# Patient Record
Sex: Female | Born: 1935 | Race: White | Hispanic: No | Marital: Married | Smoking: Never smoker
Health system: Southern US, Community
[De-identification: ages and names within clinical notes are randomized; demographics above are authoritative.]

## PROBLEM LIST (undated history)

## (undated) DIAGNOSIS — S82009A Unspecified fracture of unspecified patella, initial encounter for closed fracture: Secondary | ICD-10-CM

## (undated) DIAGNOSIS — F0390 Unspecified dementia without behavioral disturbance: Secondary | ICD-10-CM

## (undated) DIAGNOSIS — C801 Malignant (primary) neoplasm, unspecified: Secondary | ICD-10-CM

## (undated) DIAGNOSIS — J383 Other diseases of vocal cords: Secondary | ICD-10-CM

## (undated) DIAGNOSIS — M810 Age-related osteoporosis without current pathological fracture: Secondary | ICD-10-CM

## (undated) HISTORY — PX: SKIN CANCER EXCISION: SHX779

## (undated) HISTORY — PX: VAGINAL HYSTERECTOMY: SUR661

## (undated) HISTORY — DX: Unspecified dementia, unspecified severity, without behavioral disturbance, psychotic disturbance, mood disturbance, and anxiety: F03.90

## (undated) HISTORY — DX: Other diseases of vocal cords: J38.3

## (undated) HISTORY — DX: Unspecified fracture of unspecified patella, initial encounter for closed fracture: S82.009A

## (undated) HISTORY — PX: ROTATOR CUFF REPAIR: SHX139

---

## 2011-12-08 DIAGNOSIS — M818 Other osteoporosis without current pathological fracture: Secondary | ICD-10-CM | POA: Diagnosis not present

## 2011-12-08 DIAGNOSIS — E559 Vitamin D deficiency, unspecified: Secondary | ICD-10-CM | POA: Diagnosis not present

## 2011-12-16 DIAGNOSIS — Z961 Presence of intraocular lens: Secondary | ICD-10-CM | POA: Diagnosis not present

## 2011-12-21 DIAGNOSIS — N390 Urinary tract infection, site not specified: Secondary | ICD-10-CM | POA: Diagnosis not present

## 2011-12-24 ENCOUNTER — Other Ambulatory Visit (HOSPITAL_COMMUNITY): Payer: Self-pay | Admitting: *Deleted

## 2011-12-28 ENCOUNTER — Encounter (HOSPITAL_COMMUNITY)
Admission: RE | Admit: 2011-12-28 | Discharge: 2011-12-28 | Disposition: A | Discharge status: Home / Self Care | Payer: Medicare Other | Source: Ambulatory Visit | Attending: Endocrinology | Admitting: Endocrinology

## 2011-12-28 ENCOUNTER — Encounter (HOSPITAL_COMMUNITY): Payer: Self-pay

## 2011-12-28 DIAGNOSIS — M810 Age-related osteoporosis without current pathological fracture: Secondary | ICD-10-CM | POA: Diagnosis not present

## 2011-12-28 HISTORY — DX: Age-related osteoporosis without current pathological fracture: M81.0

## 2011-12-28 MED ORDER — ZOLEDRONIC ACID 5 MG/100ML IV SOLN
5.0000 mg | Freq: Once | INTRAVENOUS | Status: AC
  Administered 2011-12-28: 5 mg via INTRAVENOUS
  Filled 2011-12-28: qty 100

## 2011-12-28 MED ORDER — SODIUM CHLORIDE 0.9 % IV SOLN
Freq: Once | INTRAVENOUS | Status: AC
  Administered 2011-12-28: 16:00:00 via INTRAVENOUS

## 2012-02-10 DIAGNOSIS — M75100 Unspecified rotator cuff tear or rupture of unspecified shoulder, not specified as traumatic: Secondary | ICD-10-CM | POA: Diagnosis not present

## 2012-02-15 DIAGNOSIS — M6281 Muscle weakness (generalized): Secondary | ICD-10-CM | POA: Diagnosis not present

## 2012-02-15 DIAGNOSIS — M25519 Pain in unspecified shoulder: Secondary | ICD-10-CM | POA: Diagnosis not present

## 2012-02-16 DIAGNOSIS — M25519 Pain in unspecified shoulder: Secondary | ICD-10-CM | POA: Diagnosis not present

## 2012-02-16 DIAGNOSIS — M6281 Muscle weakness (generalized): Secondary | ICD-10-CM | POA: Diagnosis not present

## 2012-02-18 DIAGNOSIS — M25519 Pain in unspecified shoulder: Secondary | ICD-10-CM | POA: Diagnosis not present

## 2012-02-18 DIAGNOSIS — M6281 Muscle weakness (generalized): Secondary | ICD-10-CM | POA: Diagnosis not present

## 2012-02-21 DIAGNOSIS — M25519 Pain in unspecified shoulder: Secondary | ICD-10-CM | POA: Diagnosis not present

## 2012-02-21 DIAGNOSIS — M6281 Muscle weakness (generalized): Secondary | ICD-10-CM | POA: Diagnosis not present

## 2012-02-23 DIAGNOSIS — M25519 Pain in unspecified shoulder: Secondary | ICD-10-CM | POA: Diagnosis not present

## 2012-02-23 DIAGNOSIS — M6281 Muscle weakness (generalized): Secondary | ICD-10-CM | POA: Diagnosis not present

## 2012-02-24 DIAGNOSIS — M6281 Muscle weakness (generalized): Secondary | ICD-10-CM | POA: Diagnosis not present

## 2012-02-24 DIAGNOSIS — M25519 Pain in unspecified shoulder: Secondary | ICD-10-CM | POA: Diagnosis not present

## 2012-02-28 DIAGNOSIS — M25519 Pain in unspecified shoulder: Secondary | ICD-10-CM | POA: Diagnosis not present

## 2012-02-28 DIAGNOSIS — M6281 Muscle weakness (generalized): Secondary | ICD-10-CM | POA: Diagnosis not present

## 2012-03-01 DIAGNOSIS — M6281 Muscle weakness (generalized): Secondary | ICD-10-CM | POA: Diagnosis not present

## 2012-03-01 DIAGNOSIS — M25519 Pain in unspecified shoulder: Secondary | ICD-10-CM | POA: Diagnosis not present

## 2012-03-02 DIAGNOSIS — M6281 Muscle weakness (generalized): Secondary | ICD-10-CM | POA: Diagnosis not present

## 2012-03-02 DIAGNOSIS — M25519 Pain in unspecified shoulder: Secondary | ICD-10-CM | POA: Diagnosis not present

## 2012-03-06 DIAGNOSIS — M25519 Pain in unspecified shoulder: Secondary | ICD-10-CM | POA: Diagnosis not present

## 2012-03-06 DIAGNOSIS — M6281 Muscle weakness (generalized): Secondary | ICD-10-CM | POA: Diagnosis not present

## 2012-03-07 DIAGNOSIS — M25519 Pain in unspecified shoulder: Secondary | ICD-10-CM | POA: Diagnosis not present

## 2012-03-07 DIAGNOSIS — M6281 Muscle weakness (generalized): Secondary | ICD-10-CM | POA: Diagnosis not present

## 2012-03-09 DIAGNOSIS — M25519 Pain in unspecified shoulder: Secondary | ICD-10-CM | POA: Diagnosis not present

## 2012-03-09 DIAGNOSIS — M6281 Muscle weakness (generalized): Secondary | ICD-10-CM | POA: Diagnosis not present

## 2012-03-10 DIAGNOSIS — M75100 Unspecified rotator cuff tear or rupture of unspecified shoulder, not specified as traumatic: Secondary | ICD-10-CM | POA: Diagnosis not present

## 2012-04-13 DIAGNOSIS — L821 Other seborrheic keratosis: Secondary | ICD-10-CM | POA: Diagnosis not present

## 2012-04-13 DIAGNOSIS — L819 Disorder of pigmentation, unspecified: Secondary | ICD-10-CM | POA: Diagnosis not present

## 2012-04-13 DIAGNOSIS — Z85828 Personal history of other malignant neoplasm of skin: Secondary | ICD-10-CM | POA: Diagnosis not present

## 2012-07-02 DIAGNOSIS — R030 Elevated blood-pressure reading, without diagnosis of hypertension: Secondary | ICD-10-CM | POA: Diagnosis not present

## 2012-07-02 DIAGNOSIS — N390 Urinary tract infection, site not specified: Secondary | ICD-10-CM | POA: Diagnosis not present

## 2012-07-14 DIAGNOSIS — M810 Age-related osteoporosis without current pathological fracture: Secondary | ICD-10-CM | POA: Diagnosis not present

## 2012-07-14 DIAGNOSIS — E559 Vitamin D deficiency, unspecified: Secondary | ICD-10-CM | POA: Diagnosis not present

## 2012-07-14 DIAGNOSIS — N390 Urinary tract infection, site not specified: Secondary | ICD-10-CM | POA: Diagnosis not present

## 2012-07-14 DIAGNOSIS — Z79899 Other long term (current) drug therapy: Secondary | ICD-10-CM | POA: Diagnosis not present

## 2012-08-22 DIAGNOSIS — L821 Other seborrheic keratosis: Secondary | ICD-10-CM | POA: Diagnosis not present

## 2012-08-22 DIAGNOSIS — Z85828 Personal history of other malignant neoplasm of skin: Secondary | ICD-10-CM | POA: Diagnosis not present

## 2012-08-25 DIAGNOSIS — N390 Urinary tract infection, site not specified: Secondary | ICD-10-CM | POA: Diagnosis not present

## 2012-09-01 DIAGNOSIS — M75100 Unspecified rotator cuff tear or rupture of unspecified shoulder, not specified as traumatic: Secondary | ICD-10-CM | POA: Diagnosis not present

## 2012-09-03 DIAGNOSIS — H8110 Benign paroxysmal vertigo, unspecified ear: Secondary | ICD-10-CM | POA: Diagnosis not present

## 2012-09-26 DIAGNOSIS — Z23 Encounter for immunization: Secondary | ICD-10-CM | POA: Diagnosis not present

## 2012-10-06 DIAGNOSIS — N390 Urinary tract infection, site not specified: Secondary | ICD-10-CM | POA: Diagnosis not present

## 2012-10-19 DIAGNOSIS — M75100 Unspecified rotator cuff tear or rupture of unspecified shoulder, not specified as traumatic: Secondary | ICD-10-CM | POA: Diagnosis not present

## 2012-12-25 DIAGNOSIS — Z961 Presence of intraocular lens: Secondary | ICD-10-CM | POA: Diagnosis not present

## 2013-02-14 DIAGNOSIS — N390 Urinary tract infection, site not specified: Secondary | ICD-10-CM | POA: Diagnosis not present

## 2013-02-23 DIAGNOSIS — S8000XA Contusion of unspecified knee, initial encounter: Secondary | ICD-10-CM | POA: Diagnosis not present

## 2013-03-07 DIAGNOSIS — S82009A Unspecified fracture of unspecified patella, initial encounter for closed fracture: Secondary | ICD-10-CM | POA: Diagnosis not present

## 2013-04-04 DIAGNOSIS — S82009A Unspecified fracture of unspecified patella, initial encounter for closed fracture: Secondary | ICD-10-CM | POA: Diagnosis not present

## 2013-04-09 DIAGNOSIS — R269 Unspecified abnormalities of gait and mobility: Secondary | ICD-10-CM | POA: Diagnosis not present

## 2013-04-09 DIAGNOSIS — M25569 Pain in unspecified knee: Secondary | ICD-10-CM | POA: Diagnosis not present

## 2013-04-09 DIAGNOSIS — M6281 Muscle weakness (generalized): Secondary | ICD-10-CM | POA: Diagnosis not present

## 2013-04-11 DIAGNOSIS — M25569 Pain in unspecified knee: Secondary | ICD-10-CM | POA: Diagnosis not present

## 2013-04-11 DIAGNOSIS — R269 Unspecified abnormalities of gait and mobility: Secondary | ICD-10-CM | POA: Diagnosis not present

## 2013-04-11 DIAGNOSIS — M6281 Muscle weakness (generalized): Secondary | ICD-10-CM | POA: Diagnosis not present

## 2013-04-12 DIAGNOSIS — M6281 Muscle weakness (generalized): Secondary | ICD-10-CM | POA: Diagnosis not present

## 2013-04-12 DIAGNOSIS — M25569 Pain in unspecified knee: Secondary | ICD-10-CM | POA: Diagnosis not present

## 2013-04-12 DIAGNOSIS — R269 Unspecified abnormalities of gait and mobility: Secondary | ICD-10-CM | POA: Diagnosis not present

## 2013-04-12 DIAGNOSIS — D1801 Hemangioma of skin and subcutaneous tissue: Secondary | ICD-10-CM | POA: Diagnosis not present

## 2013-04-12 DIAGNOSIS — Z85828 Personal history of other malignant neoplasm of skin: Secondary | ICD-10-CM | POA: Diagnosis not present

## 2013-04-12 DIAGNOSIS — L821 Other seborrheic keratosis: Secondary | ICD-10-CM | POA: Diagnosis not present

## 2013-04-14 ENCOUNTER — Emergency Department: Payer: Self-pay | Admitting: Emergency Medicine

## 2013-04-14 DIAGNOSIS — S5010XA Contusion of unspecified forearm, initial encounter: Secondary | ICD-10-CM | POA: Diagnosis not present

## 2013-04-16 DIAGNOSIS — R269 Unspecified abnormalities of gait and mobility: Secondary | ICD-10-CM | POA: Diagnosis not present

## 2013-04-16 DIAGNOSIS — M6281 Muscle weakness (generalized): Secondary | ICD-10-CM | POA: Diagnosis not present

## 2013-04-16 DIAGNOSIS — M25569 Pain in unspecified knee: Secondary | ICD-10-CM | POA: Diagnosis not present

## 2013-04-18 DIAGNOSIS — R269 Unspecified abnormalities of gait and mobility: Secondary | ICD-10-CM | POA: Diagnosis not present

## 2013-04-18 DIAGNOSIS — M6281 Muscle weakness (generalized): Secondary | ICD-10-CM | POA: Diagnosis not present

## 2013-04-18 DIAGNOSIS — M25569 Pain in unspecified knee: Secondary | ICD-10-CM | POA: Diagnosis not present

## 2013-04-19 DIAGNOSIS — R269 Unspecified abnormalities of gait and mobility: Secondary | ICD-10-CM | POA: Diagnosis not present

## 2013-04-19 DIAGNOSIS — M25569 Pain in unspecified knee: Secondary | ICD-10-CM | POA: Diagnosis not present

## 2013-04-19 DIAGNOSIS — M6281 Muscle weakness (generalized): Secondary | ICD-10-CM | POA: Diagnosis not present

## 2013-04-24 DIAGNOSIS — M6281 Muscle weakness (generalized): Secondary | ICD-10-CM | POA: Diagnosis not present

## 2013-04-24 DIAGNOSIS — R269 Unspecified abnormalities of gait and mobility: Secondary | ICD-10-CM | POA: Diagnosis not present

## 2013-04-24 DIAGNOSIS — M25569 Pain in unspecified knee: Secondary | ICD-10-CM | POA: Diagnosis not present

## 2013-04-25 DIAGNOSIS — M25569 Pain in unspecified knee: Secondary | ICD-10-CM | POA: Diagnosis not present

## 2013-04-25 DIAGNOSIS — R269 Unspecified abnormalities of gait and mobility: Secondary | ICD-10-CM | POA: Diagnosis not present

## 2013-04-25 DIAGNOSIS — M6281 Muscle weakness (generalized): Secondary | ICD-10-CM | POA: Diagnosis not present

## 2013-04-27 DIAGNOSIS — M6281 Muscle weakness (generalized): Secondary | ICD-10-CM | POA: Diagnosis not present

## 2013-04-27 DIAGNOSIS — R269 Unspecified abnormalities of gait and mobility: Secondary | ICD-10-CM | POA: Diagnosis not present

## 2013-04-27 DIAGNOSIS — M25569 Pain in unspecified knee: Secondary | ICD-10-CM | POA: Diagnosis not present

## 2013-04-30 DIAGNOSIS — M6281 Muscle weakness (generalized): Secondary | ICD-10-CM | POA: Diagnosis not present

## 2013-04-30 DIAGNOSIS — M25569 Pain in unspecified knee: Secondary | ICD-10-CM | POA: Diagnosis not present

## 2013-04-30 DIAGNOSIS — R269 Unspecified abnormalities of gait and mobility: Secondary | ICD-10-CM | POA: Diagnosis not present

## 2013-05-02 DIAGNOSIS — M25569 Pain in unspecified knee: Secondary | ICD-10-CM | POA: Diagnosis not present

## 2013-05-02 DIAGNOSIS — M6281 Muscle weakness (generalized): Secondary | ICD-10-CM | POA: Diagnosis not present

## 2013-05-02 DIAGNOSIS — R269 Unspecified abnormalities of gait and mobility: Secondary | ICD-10-CM | POA: Diagnosis not present

## 2013-05-04 DIAGNOSIS — M6281 Muscle weakness (generalized): Secondary | ICD-10-CM | POA: Diagnosis not present

## 2013-05-04 DIAGNOSIS — R269 Unspecified abnormalities of gait and mobility: Secondary | ICD-10-CM | POA: Diagnosis not present

## 2013-05-04 DIAGNOSIS — M25569 Pain in unspecified knee: Secondary | ICD-10-CM | POA: Diagnosis not present

## 2013-05-07 DIAGNOSIS — R269 Unspecified abnormalities of gait and mobility: Secondary | ICD-10-CM | POA: Diagnosis not present

## 2013-05-07 DIAGNOSIS — M25569 Pain in unspecified knee: Secondary | ICD-10-CM | POA: Diagnosis not present

## 2013-05-07 DIAGNOSIS — M6281 Muscle weakness (generalized): Secondary | ICD-10-CM | POA: Diagnosis not present

## 2013-05-08 DIAGNOSIS — M75100 Unspecified rotator cuff tear or rupture of unspecified shoulder, not specified as traumatic: Secondary | ICD-10-CM | POA: Diagnosis not present

## 2013-05-08 DIAGNOSIS — S82009A Unspecified fracture of unspecified patella, initial encounter for closed fracture: Secondary | ICD-10-CM | POA: Diagnosis not present

## 2013-05-09 DIAGNOSIS — M6281 Muscle weakness (generalized): Secondary | ICD-10-CM | POA: Diagnosis not present

## 2013-05-09 DIAGNOSIS — M25569 Pain in unspecified knee: Secondary | ICD-10-CM | POA: Diagnosis not present

## 2013-05-09 DIAGNOSIS — R269 Unspecified abnormalities of gait and mobility: Secondary | ICD-10-CM | POA: Diagnosis not present

## 2013-05-10 ENCOUNTER — Emergency Department: Payer: Self-pay | Admitting: Internal Medicine

## 2013-05-10 DIAGNOSIS — A46 Erysipelas: Secondary | ICD-10-CM | POA: Diagnosis not present

## 2013-05-10 DIAGNOSIS — L03211 Cellulitis of face: Secondary | ICD-10-CM | POA: Diagnosis not present

## 2013-05-10 DIAGNOSIS — L0201 Cutaneous abscess of face: Secondary | ICD-10-CM | POA: Diagnosis not present

## 2013-05-10 DIAGNOSIS — L089 Local infection of the skin and subcutaneous tissue, unspecified: Secondary | ICD-10-CM | POA: Diagnosis not present

## 2013-05-10 DIAGNOSIS — M25569 Pain in unspecified knee: Secondary | ICD-10-CM | POA: Diagnosis not present

## 2013-05-10 DIAGNOSIS — R269 Unspecified abnormalities of gait and mobility: Secondary | ICD-10-CM | POA: Diagnosis not present

## 2013-05-10 DIAGNOSIS — M6281 Muscle weakness (generalized): Secondary | ICD-10-CM | POA: Diagnosis not present

## 2013-05-22 DIAGNOSIS — R269 Unspecified abnormalities of gait and mobility: Secondary | ICD-10-CM | POA: Diagnosis not present

## 2013-05-22 DIAGNOSIS — M25569 Pain in unspecified knee: Secondary | ICD-10-CM | POA: Diagnosis not present

## 2013-05-22 DIAGNOSIS — M6281 Muscle weakness (generalized): Secondary | ICD-10-CM | POA: Diagnosis not present

## 2013-05-23 DIAGNOSIS — M6281 Muscle weakness (generalized): Secondary | ICD-10-CM | POA: Diagnosis not present

## 2013-05-23 DIAGNOSIS — M25569 Pain in unspecified knee: Secondary | ICD-10-CM | POA: Diagnosis not present

## 2013-05-23 DIAGNOSIS — R269 Unspecified abnormalities of gait and mobility: Secondary | ICD-10-CM | POA: Diagnosis not present

## 2013-07-16 ENCOUNTER — Ambulatory Visit: Payer: Self-pay | Admitting: Family Medicine

## 2013-07-16 DIAGNOSIS — R1084 Generalized abdominal pain: Secondary | ICD-10-CM | POA: Diagnosis not present

## 2013-07-16 DIAGNOSIS — K5900 Constipation, unspecified: Secondary | ICD-10-CM | POA: Diagnosis not present

## 2013-07-16 DIAGNOSIS — R1031 Right lower quadrant pain: Secondary | ICD-10-CM | POA: Diagnosis not present

## 2013-07-16 DIAGNOSIS — N200 Calculus of kidney: Secondary | ICD-10-CM | POA: Diagnosis not present

## 2013-07-24 DIAGNOSIS — I059 Rheumatic mitral valve disease, unspecified: Secondary | ICD-10-CM | POA: Diagnosis not present

## 2013-07-24 DIAGNOSIS — N390 Urinary tract infection, site not specified: Secondary | ICD-10-CM | POA: Diagnosis not present

## 2013-07-24 DIAGNOSIS — Z9189 Other specified personal risk factors, not elsewhere classified: Secondary | ICD-10-CM | POA: Diagnosis not present

## 2013-07-24 DIAGNOSIS — Z79899 Other long term (current) drug therapy: Secondary | ICD-10-CM | POA: Diagnosis not present

## 2013-07-24 DIAGNOSIS — E7800 Pure hypercholesterolemia, unspecified: Secondary | ICD-10-CM | POA: Diagnosis not present

## 2013-07-24 DIAGNOSIS — R5381 Other malaise: Secondary | ICD-10-CM | POA: Diagnosis not present

## 2013-09-20 ENCOUNTER — Ambulatory Visit: Payer: Self-pay

## 2013-09-20 DIAGNOSIS — M810 Age-related osteoporosis without current pathological fracture: Secondary | ICD-10-CM | POA: Diagnosis not present

## 2013-09-24 DIAGNOSIS — N760 Acute vaginitis: Secondary | ICD-10-CM | POA: Diagnosis not present

## 2013-09-24 DIAGNOSIS — N3000 Acute cystitis without hematuria: Secondary | ICD-10-CM | POA: Diagnosis not present

## 2013-10-03 DIAGNOSIS — Z23 Encounter for immunization: Secondary | ICD-10-CM | POA: Diagnosis not present

## 2013-10-15 DIAGNOSIS — N390 Urinary tract infection, site not specified: Secondary | ICD-10-CM | POA: Diagnosis not present

## 2013-10-17 DIAGNOSIS — Q638 Other specified congenital malformations of kidney: Secondary | ICD-10-CM | POA: Diagnosis not present

## 2013-10-17 DIAGNOSIS — R339 Retention of urine, unspecified: Secondary | ICD-10-CM | POA: Diagnosis not present

## 2013-10-17 DIAGNOSIS — N3020 Other chronic cystitis without hematuria: Secondary | ICD-10-CM | POA: Diagnosis not present

## 2013-10-17 DIAGNOSIS — N200 Calculus of kidney: Secondary | ICD-10-CM | POA: Diagnosis not present

## 2013-11-05 DIAGNOSIS — N3020 Other chronic cystitis without hematuria: Secondary | ICD-10-CM | POA: Diagnosis not present

## 2013-11-05 DIAGNOSIS — N3941 Urge incontinence: Secondary | ICD-10-CM | POA: Diagnosis not present

## 2013-11-05 DIAGNOSIS — N200 Calculus of kidney: Secondary | ICD-10-CM | POA: Diagnosis not present

## 2013-11-12 DIAGNOSIS — M6281 Muscle weakness (generalized): Secondary | ICD-10-CM | POA: Diagnosis not present

## 2013-11-12 DIAGNOSIS — R42 Dizziness and giddiness: Secondary | ICD-10-CM | POA: Diagnosis not present

## 2013-11-12 DIAGNOSIS — R5383 Other fatigue: Secondary | ICD-10-CM | POA: Diagnosis not present

## 2013-11-12 DIAGNOSIS — Z79899 Other long term (current) drug therapy: Secondary | ICD-10-CM | POA: Diagnosis not present

## 2013-11-12 DIAGNOSIS — R5381 Other malaise: Secondary | ICD-10-CM | POA: Diagnosis not present

## 2013-11-21 DIAGNOSIS — H905 Unspecified sensorineural hearing loss: Secondary | ICD-10-CM | POA: Diagnosis not present

## 2013-11-21 DIAGNOSIS — H6120 Impacted cerumen, unspecified ear: Secondary | ICD-10-CM | POA: Diagnosis not present

## 2013-11-21 DIAGNOSIS — R42 Dizziness and giddiness: Secondary | ICD-10-CM | POA: Diagnosis not present

## 2013-12-03 ENCOUNTER — Other Ambulatory Visit: Payer: Self-pay | Admitting: Family Medicine

## 2013-12-03 DIAGNOSIS — I959 Hypotension, unspecified: Secondary | ICD-10-CM | POA: Diagnosis not present

## 2013-12-03 DIAGNOSIS — R42 Dizziness and giddiness: Secondary | ICD-10-CM | POA: Diagnosis not present

## 2013-12-03 LAB — BASIC METABOLIC PANEL
Anion Gap: 5 — ABNORMAL LOW (ref 7–16)
BUN: 20 mg/dL — ABNORMAL HIGH (ref 7–18)
Calcium, Total: 10.2 mg/dL — ABNORMAL HIGH (ref 8.5–10.1)
Chloride: 100 mmol/L (ref 98–107)
Co2: 32 mmol/L (ref 21–32)
Creatinine: 0.58 mg/dL — ABNORMAL LOW (ref 0.60–1.30)
EGFR (African American): >60
EGFR (Non-African Amer.): >60
Glucose: 77 mg/dL (ref 65–99)
Osmolality: 275 (ref 275–301)
Potassium: 3.9 mmol/L (ref 3.5–5.1)
Sodium: 137 mmol/L (ref 136–145)

## 2013-12-05 DIAGNOSIS — R55 Syncope and collapse: Secondary | ICD-10-CM | POA: Diagnosis not present

## 2013-12-05 DIAGNOSIS — R42 Dizziness and giddiness: Secondary | ICD-10-CM | POA: Diagnosis not present

## 2013-12-11 DIAGNOSIS — L723 Sebaceous cyst: Secondary | ICD-10-CM | POA: Diagnosis not present

## 2013-12-11 DIAGNOSIS — D485 Neoplasm of uncertain behavior of skin: Secondary | ICD-10-CM | POA: Diagnosis not present

## 2013-12-11 DIAGNOSIS — L821 Other seborrheic keratosis: Secondary | ICD-10-CM | POA: Diagnosis not present

## 2013-12-14 DIAGNOSIS — R42 Dizziness and giddiness: Secondary | ICD-10-CM | POA: Diagnosis not present

## 2013-12-14 DIAGNOSIS — R55 Syncope and collapse: Secondary | ICD-10-CM | POA: Diagnosis not present

## 2013-12-21 DIAGNOSIS — R569 Unspecified convulsions: Secondary | ICD-10-CM | POA: Diagnosis not present

## 2013-12-21 DIAGNOSIS — R42 Dizziness and giddiness: Secondary | ICD-10-CM | POA: Diagnosis not present

## 2014-01-11 ENCOUNTER — Ambulatory Visit: Payer: Self-pay | Admitting: Neurology

## 2014-01-11 DIAGNOSIS — R42 Dizziness and giddiness: Secondary | ICD-10-CM | POA: Diagnosis not present

## 2014-01-25 ENCOUNTER — Ambulatory Visit: Payer: Self-pay | Admitting: Neurology

## 2014-01-25 DIAGNOSIS — R42 Dizziness and giddiness: Secondary | ICD-10-CM | POA: Diagnosis not present

## 2014-01-25 DIAGNOSIS — I658 Occlusion and stenosis of other precerebral arteries: Secondary | ICD-10-CM | POA: Diagnosis not present

## 2014-02-05 DIAGNOSIS — M542 Cervicalgia: Secondary | ICD-10-CM | POA: Diagnosis not present

## 2014-02-12 ENCOUNTER — Ambulatory Visit: Payer: Self-pay | Admitting: Internal Medicine

## 2014-02-12 DIAGNOSIS — M47812 Spondylosis without myelopathy or radiculopathy, cervical region: Secondary | ICD-10-CM | POA: Diagnosis not present

## 2014-02-12 DIAGNOSIS — M5030 Other cervical disc degeneration, unspecified cervical region: Secondary | ICD-10-CM | POA: Diagnosis not present

## 2014-03-19 DIAGNOSIS — R42 Dizziness and giddiness: Secondary | ICD-10-CM | POA: Diagnosis not present

## 2014-03-19 DIAGNOSIS — I491 Atrial premature depolarization: Secondary | ICD-10-CM | POA: Diagnosis not present

## 2014-03-19 DIAGNOSIS — I471 Supraventricular tachycardia: Secondary | ICD-10-CM | POA: Diagnosis not present

## 2014-03-29 DIAGNOSIS — M542 Cervicalgia: Secondary | ICD-10-CM | POA: Diagnosis not present

## 2014-04-02 DIAGNOSIS — R262 Difficulty in walking, not elsewhere classified: Secondary | ICD-10-CM | POA: Diagnosis not present

## 2014-04-02 DIAGNOSIS — M6281 Muscle weakness (generalized): Secondary | ICD-10-CM | POA: Diagnosis not present

## 2014-04-03 DIAGNOSIS — Q638 Other specified congenital malformations of kidney: Secondary | ICD-10-CM | POA: Diagnosis not present

## 2014-04-03 DIAGNOSIS — N3020 Other chronic cystitis without hematuria: Secondary | ICD-10-CM | POA: Diagnosis not present

## 2014-04-03 DIAGNOSIS — N200 Calculus of kidney: Secondary | ICD-10-CM | POA: Diagnosis not present

## 2014-04-03 DIAGNOSIS — R339 Retention of urine, unspecified: Secondary | ICD-10-CM | POA: Diagnosis not present

## 2014-04-04 DIAGNOSIS — M6281 Muscle weakness (generalized): Secondary | ICD-10-CM | POA: Diagnosis not present

## 2014-04-04 DIAGNOSIS — R262 Difficulty in walking, not elsewhere classified: Secondary | ICD-10-CM | POA: Diagnosis not present

## 2014-04-08 DIAGNOSIS — R262 Difficulty in walking, not elsewhere classified: Secondary | ICD-10-CM | POA: Diagnosis not present

## 2014-04-08 DIAGNOSIS — M6281 Muscle weakness (generalized): Secondary | ICD-10-CM | POA: Diagnosis not present

## 2014-04-10 DIAGNOSIS — R262 Difficulty in walking, not elsewhere classified: Secondary | ICD-10-CM | POA: Diagnosis not present

## 2014-04-10 DIAGNOSIS — M6281 Muscle weakness (generalized): Secondary | ICD-10-CM | POA: Diagnosis not present

## 2014-04-11 DIAGNOSIS — M6281 Muscle weakness (generalized): Secondary | ICD-10-CM | POA: Diagnosis not present

## 2014-04-11 DIAGNOSIS — R262 Difficulty in walking, not elsewhere classified: Secondary | ICD-10-CM | POA: Diagnosis not present

## 2014-04-15 DIAGNOSIS — M6281 Muscle weakness (generalized): Secondary | ICD-10-CM | POA: Diagnosis not present

## 2014-04-15 DIAGNOSIS — R262 Difficulty in walking, not elsewhere classified: Secondary | ICD-10-CM | POA: Diagnosis not present

## 2014-04-17 DIAGNOSIS — M6281 Muscle weakness (generalized): Secondary | ICD-10-CM | POA: Diagnosis not present

## 2014-04-17 DIAGNOSIS — R262 Difficulty in walking, not elsewhere classified: Secondary | ICD-10-CM | POA: Diagnosis not present

## 2014-04-18 DIAGNOSIS — R262 Difficulty in walking, not elsewhere classified: Secondary | ICD-10-CM | POA: Diagnosis not present

## 2014-04-18 DIAGNOSIS — M6281 Muscle weakness (generalized): Secondary | ICD-10-CM | POA: Diagnosis not present

## 2014-04-29 DIAGNOSIS — D1801 Hemangioma of skin and subcutaneous tissue: Secondary | ICD-10-CM | POA: Diagnosis not present

## 2014-04-29 DIAGNOSIS — Z85828 Personal history of other malignant neoplasm of skin: Secondary | ICD-10-CM | POA: Diagnosis not present

## 2014-04-29 DIAGNOSIS — L570 Actinic keratosis: Secondary | ICD-10-CM | POA: Diagnosis not present

## 2014-04-29 DIAGNOSIS — L821 Other seborrheic keratosis: Secondary | ICD-10-CM | POA: Diagnosis not present

## 2014-08-01 DIAGNOSIS — E785 Hyperlipidemia, unspecified: Secondary | ICD-10-CM | POA: Diagnosis not present

## 2014-08-01 DIAGNOSIS — R7309 Other abnormal glucose: Secondary | ICD-10-CM | POA: Diagnosis not present

## 2014-08-01 DIAGNOSIS — I059 Rheumatic mitral valve disease, unspecified: Secondary | ICD-10-CM | POA: Diagnosis not present

## 2014-08-01 DIAGNOSIS — Z79899 Other long term (current) drug therapy: Secondary | ICD-10-CM | POA: Diagnosis not present

## 2014-08-02 DIAGNOSIS — Z79899 Other long term (current) drug therapy: Secondary | ICD-10-CM | POA: Diagnosis not present

## 2014-08-02 DIAGNOSIS — R7309 Other abnormal glucose: Secondary | ICD-10-CM | POA: Diagnosis not present

## 2014-08-02 DIAGNOSIS — E785 Hyperlipidemia, unspecified: Secondary | ICD-10-CM | POA: Diagnosis not present

## 2014-08-11 ENCOUNTER — Inpatient Hospital Stay: Payer: Self-pay | Admitting: Internal Medicine

## 2014-08-11 DIAGNOSIS — R262 Difficulty in walking, not elsewhere classified: Secondary | ICD-10-CM | POA: Diagnosis not present

## 2014-08-11 DIAGNOSIS — S82009A Unspecified fracture of unspecified patella, initial encounter for closed fracture: Secondary | ICD-10-CM | POA: Diagnosis not present

## 2014-08-11 DIAGNOSIS — S8290XD Unspecified fracture of unspecified lower leg, subsequent encounter for closed fracture with routine healing: Secondary | ICD-10-CM | POA: Diagnosis not present

## 2014-08-11 DIAGNOSIS — IMO0001 Reserved for inherently not codable concepts without codable children: Secondary | ICD-10-CM | POA: Diagnosis not present

## 2014-08-11 DIAGNOSIS — S62609A Fracture of unspecified phalanx of unspecified finger, initial encounter for closed fracture: Secondary | ICD-10-CM | POA: Diagnosis not present

## 2014-08-11 DIAGNOSIS — M6281 Muscle weakness (generalized): Secondary | ICD-10-CM | POA: Diagnosis not present

## 2014-08-11 DIAGNOSIS — M810 Age-related osteoporosis without current pathological fracture: Secondary | ICD-10-CM | POA: Diagnosis not present

## 2014-08-11 DIAGNOSIS — F0390 Unspecified dementia without behavioral disturbance: Secondary | ICD-10-CM | POA: Diagnosis not present

## 2014-08-11 DIAGNOSIS — S62639A Displaced fracture of distal phalanx of unspecified finger, initial encounter for closed fracture: Secondary | ICD-10-CM | POA: Diagnosis not present

## 2014-08-11 DIAGNOSIS — Z7982 Long term (current) use of aspirin: Secondary | ICD-10-CM | POA: Diagnosis not present

## 2014-08-11 LAB — COMPREHENSIVE METABOLIC PANEL
ALBUMIN: 4 g/dL (ref 3.4–5.0)
ANION GAP: 11 (ref 7–16)
Alkaline Phosphatase: 75 U/L
BUN: 19 mg/dL — ABNORMAL HIGH (ref 7–18)
Bilirubin,Total: 0.3 mg/dL (ref 0.2–1.0)
CALCIUM: 9 mg/dL (ref 8.5–10.1)
CO2: 25 mmol/L (ref 21–32)
Chloride: 107 mmol/L (ref 98–107)
Creatinine: 0.64 mg/dL (ref 0.60–1.30)
EGFR (Non-African Amer.): >60
GLUCOSE: 93 mg/dL (ref 65–99)
Osmolality: 287 (ref 275–301)
Potassium: 4 mmol/L (ref 3.5–5.1)
SGOT(AST): 55 U/L — ABNORMAL HIGH (ref 15–37)
SGPT (ALT): 37 U/L
Sodium: 143 mmol/L (ref 136–145)
TOTAL PROTEIN: 8 g/dL (ref 6.4–8.2)

## 2014-08-11 LAB — URINALYSIS, COMPLETE
BLOOD: NEGATIVE
Bilirubin,UR: NEGATIVE
GLUCOSE, UR: NEGATIVE mg/dL (ref 0–75)
Ketone: NEGATIVE
Leukocyte Esterase: NEGATIVE
NITRITE: NEGATIVE
Ph: 7 (ref 4.5–8.0)
Protein: NEGATIVE
RBC,UR: 4 /HPF (ref 0–5)
Specific Gravity: 1.015 (ref 1.003–1.030)
Squamous Epithelial: NONE SEEN

## 2014-08-11 LAB — CBC WITH DIFFERENTIAL/PLATELET
Basophil #: 0 10*3/uL (ref 0.0–0.1)
Basophil %: 0.4 %
EOS ABS: 0 10*3/uL (ref 0.0–0.7)
Eosinophil %: 0.5 %
HCT: 41.1 % (ref 35.0–47.0)
HGB: 13.9 g/dL (ref 12.0–16.0)
Lymphocyte #: 0.8 10*3/uL — ABNORMAL LOW (ref 1.0–3.6)
Lymphocyte %: 13.5 %
MCH: 33.8 pg (ref 26.0–34.0)
MCHC: 33.8 g/dL (ref 32.0–36.0)
MCV: 100 fL (ref 80–100)
MONOS PCT: 7.7 %
Monocyte #: 0.4 x10 3/mm (ref 0.2–0.9)
NEUTROS PCT: 77.9 %
Neutrophil #: 4.5 10*3/uL (ref 1.4–6.5)
Platelet: 170 10*3/uL (ref 150–440)
RBC: 4.11 10*6/uL (ref 3.80–5.20)
RDW: 13.2 % (ref 11.5–14.5)
WBC: 5.8 10*3/uL (ref 3.6–11.0)

## 2014-08-12 LAB — CBC WITH DIFFERENTIAL/PLATELET
BASOS PCT: 0.4 %
Basophil #: 0 10*3/uL (ref 0.0–0.1)
EOS PCT: 1.1 %
Eosinophil #: 0.1 10*3/uL (ref 0.0–0.7)
HCT: 33.2 % — ABNORMAL LOW (ref 35.0–47.0)
HGB: 11.5 g/dL — AB (ref 12.0–16.0)
Lymphocyte #: 1.1 10*3/uL (ref 1.0–3.6)
Lymphocyte %: 21.3 %
MCH: 34 pg (ref 26.0–34.0)
MCHC: 34.5 g/dL (ref 32.0–36.0)
MCV: 98 fL (ref 80–100)
Monocyte #: 0.5 x10 3/mm (ref 0.2–0.9)
Monocyte %: 9.8 %
Neutrophil #: 3.5 10*3/uL (ref 1.4–6.5)
Neutrophil %: 67.4 %
Platelet: 154 10*3/uL (ref 150–440)
RBC: 3.38 10*6/uL — AB (ref 3.80–5.20)
RDW: 13.3 % (ref 11.5–14.5)
WBC: 5.2 10*3/uL (ref 3.6–11.0)

## 2014-08-12 LAB — BASIC METABOLIC PANEL
Anion Gap: 9 (ref 7–16)
BUN: 20 mg/dL — AB (ref 7–18)
CALCIUM: 8.9 mg/dL (ref 8.5–10.1)
CHLORIDE: 109 mmol/L — AB (ref 98–107)
Co2: 26 mmol/L (ref 21–32)
Creatinine: 0.74 mg/dL (ref 0.60–1.30)
EGFR (African American): >60
EGFR (Non-African Amer.): >60
Glucose: 81 mg/dL (ref 65–99)
Osmolality: 288 (ref 275–301)
POTASSIUM: 3.8 mmol/L (ref 3.5–5.1)
Sodium: 144 mmol/L (ref 136–145)

## 2014-08-13 LAB — BASIC METABOLIC PANEL
ANION GAP: 9 (ref 7–16)
BUN: 12 mg/dL (ref 7–18)
CHLORIDE: 109 mmol/L — AB (ref 98–107)
CO2: 24 mmol/L (ref 21–32)
Calcium, Total: 8.4 mg/dL — ABNORMAL LOW (ref 8.5–10.1)
Creatinine: 0.56 mg/dL — ABNORMAL LOW (ref 0.60–1.30)
EGFR (African American): >60
Glucose: 88 mg/dL (ref 65–99)
Osmolality: 282 (ref 275–301)
Potassium: 3.8 mmol/L (ref 3.5–5.1)
Sodium: 142 mmol/L (ref 136–145)

## 2014-08-13 LAB — CBC WITH DIFFERENTIAL/PLATELET
Basophil #: 0 10*3/uL (ref 0.0–0.1)
Basophil %: 0.6 %
Eosinophil #: 0.1 10*3/uL (ref 0.0–0.7)
Eosinophil %: 1.6 %
HCT: 34.8 % — AB (ref 35.0–47.0)
HGB: 11.7 g/dL — ABNORMAL LOW (ref 12.0–16.0)
Lymphocyte #: 1 10*3/uL (ref 1.0–3.6)
Lymphocyte %: 24.3 %
MCH: 33.3 pg (ref 26.0–34.0)
MCHC: 33.5 g/dL (ref 32.0–36.0)
MCV: 99 fL (ref 80–100)
Monocyte #: 0.4 x10 3/mm (ref 0.2–0.9)
Monocyte %: 9.5 %
Neutrophil #: 2.6 10*3/uL (ref 1.4–6.5)
Neutrophil %: 64 %
PLATELETS: 149 10*3/uL — AB (ref 150–440)
RBC: 3.51 10*6/uL — AB (ref 3.80–5.20)
RDW: 13.3 % (ref 11.5–14.5)
WBC: 4 10*3/uL (ref 3.6–11.0)

## 2014-08-14 DIAGNOSIS — M810 Age-related osteoporosis without current pathological fracture: Secondary | ICD-10-CM | POA: Diagnosis not present

## 2014-08-14 DIAGNOSIS — S62609A Fracture of unspecified phalanx of unspecified finger, initial encounter for closed fracture: Secondary | ICD-10-CM | POA: Diagnosis not present

## 2014-08-14 DIAGNOSIS — S62233A Other displaced fracture of base of first metacarpal bone, unspecified hand, initial encounter for closed fracture: Secondary | ICD-10-CM | POA: Diagnosis not present

## 2014-08-14 DIAGNOSIS — F0390 Unspecified dementia without behavioral disturbance: Secondary | ICD-10-CM | POA: Diagnosis not present

## 2014-08-14 DIAGNOSIS — S62501D Fracture of unspecified phalanx of right thumb, subsequent encounter for fracture with routine healing: Secondary | ICD-10-CM | POA: Diagnosis not present

## 2014-08-14 DIAGNOSIS — M6281 Muscle weakness (generalized): Secondary | ICD-10-CM | POA: Diagnosis not present

## 2014-08-14 DIAGNOSIS — F068 Other specified mental disorders due to known physiological condition: Secondary | ICD-10-CM | POA: Diagnosis not present

## 2014-08-14 DIAGNOSIS — S82009S Unspecified fracture of unspecified patella, sequela: Secondary | ICD-10-CM | POA: Diagnosis not present

## 2014-08-14 DIAGNOSIS — S82009D Unspecified fracture of unspecified patella, subsequent encounter for closed fracture with routine healing: Secondary | ICD-10-CM | POA: Diagnosis not present

## 2014-08-14 DIAGNOSIS — IMO0001 Reserved for inherently not codable concepts without codable children: Secondary | ICD-10-CM | POA: Diagnosis not present

## 2014-08-14 DIAGNOSIS — R262 Difficulty in walking, not elsewhere classified: Secondary | ICD-10-CM | POA: Diagnosis not present

## 2014-08-14 DIAGNOSIS — S62514P Nondisplaced fracture of proximal phalanx of right thumb, subsequent encounter for fracture with malunion: Secondary | ICD-10-CM | POA: Diagnosis not present

## 2014-08-14 DIAGNOSIS — S82009A Unspecified fracture of unspecified patella, initial encounter for closed fracture: Secondary | ICD-10-CM | POA: Diagnosis not present

## 2014-08-14 DIAGNOSIS — S8290XD Unspecified fracture of unspecified lower leg, subsequent encounter for closed fracture with routine healing: Secondary | ICD-10-CM | POA: Diagnosis not present

## 2014-08-16 DIAGNOSIS — S62233A Other displaced fracture of base of first metacarpal bone, unspecified hand, initial encounter for closed fracture: Secondary | ICD-10-CM | POA: Diagnosis not present

## 2014-08-16 DIAGNOSIS — M810 Age-related osteoporosis without current pathological fracture: Secondary | ICD-10-CM | POA: Diagnosis not present

## 2014-08-16 DIAGNOSIS — S82009A Unspecified fracture of unspecified patella, initial encounter for closed fracture: Secondary | ICD-10-CM

## 2014-08-16 DIAGNOSIS — F068 Other specified mental disorders due to known physiological condition: Secondary | ICD-10-CM

## 2014-08-29 DIAGNOSIS — S82009S Unspecified fracture of unspecified patella, sequela: Secondary | ICD-10-CM | POA: Diagnosis not present

## 2014-08-29 DIAGNOSIS — F0390 Unspecified dementia without behavioral disturbance: Secondary | ICD-10-CM | POA: Diagnosis not present

## 2014-08-29 DIAGNOSIS — M810 Age-related osteoporosis without current pathological fracture: Secondary | ICD-10-CM

## 2014-08-29 DIAGNOSIS — S62514P Nondisplaced fracture of proximal phalanx of right thumb, subsequent encounter for fracture with malunion: Secondary | ICD-10-CM

## 2014-09-02 DIAGNOSIS — S82009D Unspecified fracture of unspecified patella, subsequent encounter for closed fracture with routine healing: Secondary | ICD-10-CM | POA: Diagnosis not present

## 2014-09-02 DIAGNOSIS — S82045D Nondisplaced comminuted fracture of left patella, subsequent encounter for closed fracture with routine healing: Secondary | ICD-10-CM | POA: Diagnosis not present

## 2014-09-02 DIAGNOSIS — S62521D Displaced fracture of distal phalanx of right thumb, subsequent encounter for fracture with routine healing: Secondary | ICD-10-CM | POA: Diagnosis not present

## 2014-09-02 DIAGNOSIS — R2689 Other abnormalities of gait and mobility: Secondary | ICD-10-CM | POA: Diagnosis not present

## 2014-09-02 DIAGNOSIS — S82034D Nondisplaced transverse fracture of right patella, subsequent encounter for closed fracture with routine healing: Secondary | ICD-10-CM | POA: Diagnosis not present

## 2014-09-02 DIAGNOSIS — M6281 Muscle weakness (generalized): Secondary | ICD-10-CM | POA: Diagnosis not present

## 2014-09-03 DIAGNOSIS — M6281 Muscle weakness (generalized): Secondary | ICD-10-CM | POA: Diagnosis not present

## 2014-09-03 DIAGNOSIS — S82009D Unspecified fracture of unspecified patella, subsequent encounter for closed fracture with routine healing: Secondary | ICD-10-CM | POA: Diagnosis not present

## 2014-09-03 DIAGNOSIS — R2689 Other abnormalities of gait and mobility: Secondary | ICD-10-CM | POA: Diagnosis not present

## 2014-09-05 DIAGNOSIS — S82009D Unspecified fracture of unspecified patella, subsequent encounter for closed fracture with routine healing: Secondary | ICD-10-CM | POA: Diagnosis not present

## 2014-09-05 DIAGNOSIS — M6281 Muscle weakness (generalized): Secondary | ICD-10-CM | POA: Diagnosis not present

## 2014-09-05 DIAGNOSIS — R2689 Other abnormalities of gait and mobility: Secondary | ICD-10-CM | POA: Diagnosis not present

## 2014-09-09 DIAGNOSIS — R2689 Other abnormalities of gait and mobility: Secondary | ICD-10-CM | POA: Diagnosis not present

## 2014-09-09 DIAGNOSIS — M6281 Muscle weakness (generalized): Secondary | ICD-10-CM | POA: Diagnosis not present

## 2014-09-09 DIAGNOSIS — S82009D Unspecified fracture of unspecified patella, subsequent encounter for closed fracture with routine healing: Secondary | ICD-10-CM | POA: Diagnosis not present

## 2014-09-11 DIAGNOSIS — M6281 Muscle weakness (generalized): Secondary | ICD-10-CM | POA: Diagnosis not present

## 2014-09-11 DIAGNOSIS — S82009D Unspecified fracture of unspecified patella, subsequent encounter for closed fracture with routine healing: Secondary | ICD-10-CM | POA: Diagnosis not present

## 2014-09-11 DIAGNOSIS — R2689 Other abnormalities of gait and mobility: Secondary | ICD-10-CM | POA: Diagnosis not present

## 2014-09-12 DIAGNOSIS — S82009D Unspecified fracture of unspecified patella, subsequent encounter for closed fracture with routine healing: Secondary | ICD-10-CM | POA: Diagnosis not present

## 2014-09-12 DIAGNOSIS — R2689 Other abnormalities of gait and mobility: Secondary | ICD-10-CM | POA: Diagnosis not present

## 2014-09-12 DIAGNOSIS — M6281 Muscle weakness (generalized): Secondary | ICD-10-CM | POA: Diagnosis not present

## 2014-09-17 DIAGNOSIS — R2689 Other abnormalities of gait and mobility: Secondary | ICD-10-CM | POA: Diagnosis not present

## 2014-09-17 DIAGNOSIS — M6281 Muscle weakness (generalized): Secondary | ICD-10-CM | POA: Diagnosis not present

## 2014-09-17 DIAGNOSIS — S82009D Unspecified fracture of unspecified patella, subsequent encounter for closed fracture with routine healing: Secondary | ICD-10-CM | POA: Diagnosis not present

## 2014-09-18 DIAGNOSIS — R2689 Other abnormalities of gait and mobility: Secondary | ICD-10-CM | POA: Diagnosis not present

## 2014-09-18 DIAGNOSIS — M6281 Muscle weakness (generalized): Secondary | ICD-10-CM | POA: Diagnosis not present

## 2014-09-18 DIAGNOSIS — S82009D Unspecified fracture of unspecified patella, subsequent encounter for closed fracture with routine healing: Secondary | ICD-10-CM | POA: Diagnosis not present

## 2014-09-19 DIAGNOSIS — R2689 Other abnormalities of gait and mobility: Secondary | ICD-10-CM | POA: Diagnosis not present

## 2014-09-19 DIAGNOSIS — Z23 Encounter for immunization: Secondary | ICD-10-CM | POA: Diagnosis not present

## 2014-09-19 DIAGNOSIS — M6281 Muscle weakness (generalized): Secondary | ICD-10-CM | POA: Diagnosis not present

## 2014-09-19 DIAGNOSIS — S82009D Unspecified fracture of unspecified patella, subsequent encounter for closed fracture with routine healing: Secondary | ICD-10-CM | POA: Diagnosis not present

## 2014-09-20 DIAGNOSIS — S82009D Unspecified fracture of unspecified patella, subsequent encounter for closed fracture with routine healing: Secondary | ICD-10-CM | POA: Diagnosis not present

## 2014-09-20 DIAGNOSIS — R2689 Other abnormalities of gait and mobility: Secondary | ICD-10-CM | POA: Diagnosis not present

## 2014-09-20 DIAGNOSIS — M6281 Muscle weakness (generalized): Secondary | ICD-10-CM | POA: Diagnosis not present

## 2014-09-23 DIAGNOSIS — S62521D Displaced fracture of distal phalanx of right thumb, subsequent encounter for fracture with routine healing: Secondary | ICD-10-CM | POA: Diagnosis not present

## 2014-09-23 DIAGNOSIS — S82034D Nondisplaced transverse fracture of right patella, subsequent encounter for closed fracture with routine healing: Secondary | ICD-10-CM | POA: Diagnosis not present

## 2014-09-23 DIAGNOSIS — S82045D Nondisplaced comminuted fracture of left patella, subsequent encounter for closed fracture with routine healing: Secondary | ICD-10-CM | POA: Diagnosis not present

## 2014-09-24 DIAGNOSIS — M6281 Muscle weakness (generalized): Secondary | ICD-10-CM | POA: Diagnosis not present

## 2014-09-24 DIAGNOSIS — S82009D Unspecified fracture of unspecified patella, subsequent encounter for closed fracture with routine healing: Secondary | ICD-10-CM | POA: Diagnosis not present

## 2014-09-24 DIAGNOSIS — R2689 Other abnormalities of gait and mobility: Secondary | ICD-10-CM | POA: Diagnosis not present

## 2014-09-25 DIAGNOSIS — S82009D Unspecified fracture of unspecified patella, subsequent encounter for closed fracture with routine healing: Secondary | ICD-10-CM | POA: Diagnosis not present

## 2014-09-25 DIAGNOSIS — R2689 Other abnormalities of gait and mobility: Secondary | ICD-10-CM | POA: Diagnosis not present

## 2014-09-25 DIAGNOSIS — M6281 Muscle weakness (generalized): Secondary | ICD-10-CM | POA: Diagnosis not present

## 2014-09-26 DIAGNOSIS — S82009D Unspecified fracture of unspecified patella, subsequent encounter for closed fracture with routine healing: Secondary | ICD-10-CM | POA: Diagnosis not present

## 2014-09-26 DIAGNOSIS — M6281 Muscle weakness (generalized): Secondary | ICD-10-CM | POA: Diagnosis not present

## 2014-09-26 DIAGNOSIS — R2689 Other abnormalities of gait and mobility: Secondary | ICD-10-CM | POA: Diagnosis not present

## 2014-09-30 DIAGNOSIS — S82009D Unspecified fracture of unspecified patella, subsequent encounter for closed fracture with routine healing: Secondary | ICD-10-CM | POA: Diagnosis not present

## 2014-09-30 DIAGNOSIS — M6281 Muscle weakness (generalized): Secondary | ICD-10-CM | POA: Diagnosis not present

## 2014-09-30 DIAGNOSIS — R2689 Other abnormalities of gait and mobility: Secondary | ICD-10-CM | POA: Diagnosis not present

## 2014-10-14 DIAGNOSIS — S62521D Displaced fracture of distal phalanx of right thumb, subsequent encounter for fracture with routine healing: Secondary | ICD-10-CM | POA: Diagnosis not present

## 2014-10-14 DIAGNOSIS — S82034D Nondisplaced transverse fracture of right patella, subsequent encounter for closed fracture with routine healing: Secondary | ICD-10-CM | POA: Diagnosis not present

## 2014-10-14 DIAGNOSIS — S82045D Nondisplaced comminuted fracture of left patella, subsequent encounter for closed fracture with routine healing: Secondary | ICD-10-CM | POA: Diagnosis not present

## 2014-10-30 DIAGNOSIS — M6281 Muscle weakness (generalized): Secondary | ICD-10-CM | POA: Diagnosis not present

## 2014-10-30 DIAGNOSIS — R2689 Other abnormalities of gait and mobility: Secondary | ICD-10-CM | POA: Diagnosis not present

## 2014-10-30 DIAGNOSIS — S82009D Unspecified fracture of unspecified patella, subsequent encounter for closed fracture with routine healing: Secondary | ICD-10-CM | POA: Diagnosis not present

## 2014-11-01 DIAGNOSIS — S82009D Unspecified fracture of unspecified patella, subsequent encounter for closed fracture with routine healing: Secondary | ICD-10-CM | POA: Diagnosis not present

## 2014-11-01 DIAGNOSIS — R2689 Other abnormalities of gait and mobility: Secondary | ICD-10-CM | POA: Diagnosis not present

## 2014-11-01 DIAGNOSIS — M6281 Muscle weakness (generalized): Secondary | ICD-10-CM | POA: Diagnosis not present

## 2014-11-04 DIAGNOSIS — R2689 Other abnormalities of gait and mobility: Secondary | ICD-10-CM | POA: Diagnosis not present

## 2014-11-04 DIAGNOSIS — M6281 Muscle weakness (generalized): Secondary | ICD-10-CM | POA: Diagnosis not present

## 2014-11-04 DIAGNOSIS — S82009D Unspecified fracture of unspecified patella, subsequent encounter for closed fracture with routine healing: Secondary | ICD-10-CM | POA: Diagnosis not present

## 2014-11-07 DIAGNOSIS — R2689 Other abnormalities of gait and mobility: Secondary | ICD-10-CM | POA: Diagnosis not present

## 2014-11-07 DIAGNOSIS — M6281 Muscle weakness (generalized): Secondary | ICD-10-CM | POA: Diagnosis not present

## 2014-11-07 DIAGNOSIS — S82009D Unspecified fracture of unspecified patella, subsequent encounter for closed fracture with routine healing: Secondary | ICD-10-CM | POA: Diagnosis not present

## 2014-11-11 DIAGNOSIS — R2689 Other abnormalities of gait and mobility: Secondary | ICD-10-CM | POA: Diagnosis not present

## 2014-11-11 DIAGNOSIS — M6281 Muscle weakness (generalized): Secondary | ICD-10-CM | POA: Diagnosis not present

## 2014-11-11 DIAGNOSIS — S82009D Unspecified fracture of unspecified patella, subsequent encounter for closed fracture with routine healing: Secondary | ICD-10-CM | POA: Diagnosis not present

## 2014-11-14 DIAGNOSIS — M6281 Muscle weakness (generalized): Secondary | ICD-10-CM | POA: Diagnosis not present

## 2014-11-14 DIAGNOSIS — R2689 Other abnormalities of gait and mobility: Secondary | ICD-10-CM | POA: Diagnosis not present

## 2014-11-14 DIAGNOSIS — S82009D Unspecified fracture of unspecified patella, subsequent encounter for closed fracture with routine healing: Secondary | ICD-10-CM | POA: Diagnosis not present

## 2014-11-20 DIAGNOSIS — B351 Tinea unguium: Secondary | ICD-10-CM | POA: Diagnosis not present

## 2014-11-20 DIAGNOSIS — E1151 Type 2 diabetes mellitus with diabetic peripheral angiopathy without gangrene: Secondary | ICD-10-CM | POA: Diagnosis not present

## 2014-11-20 DIAGNOSIS — Z9181 History of falling: Secondary | ICD-10-CM | POA: Diagnosis not present

## 2014-11-20 DIAGNOSIS — R260 Ataxic gait: Secondary | ICD-10-CM | POA: Diagnosis not present

## 2014-12-31 ENCOUNTER — Ambulatory Visit: Admitting: Internal Medicine

## 2015-01-06 DIAGNOSIS — S82034D Nondisplaced transverse fracture of right patella, subsequent encounter for closed fracture with routine healing: Secondary | ICD-10-CM | POA: Diagnosis not present

## 2015-01-06 DIAGNOSIS — S82045D Nondisplaced comminuted fracture of left patella, subsequent encounter for closed fracture with routine healing: Secondary | ICD-10-CM | POA: Diagnosis not present

## 2015-01-06 DIAGNOSIS — M25562 Pain in left knee: Secondary | ICD-10-CM | POA: Diagnosis not present

## 2015-03-01 DIAGNOSIS — J209 Acute bronchitis, unspecified: Secondary | ICD-10-CM | POA: Diagnosis not present

## 2015-03-06 ENCOUNTER — Ambulatory Visit (INDEPENDENT_AMBULATORY_CARE_PROVIDER_SITE_OTHER): Payer: Medicare Other | Admitting: Internal Medicine

## 2015-03-06 ENCOUNTER — Encounter: Payer: Self-pay | Admitting: Internal Medicine

## 2015-03-06 VITALS — BP 120/60 | HR 95 | Temp 98.4°F | Wt 78.0 lb

## 2015-03-06 DIAGNOSIS — R5383 Other fatigue: Secondary | ICD-10-CM

## 2015-03-06 DIAGNOSIS — F0390 Unspecified dementia without behavioral disturbance: Secondary | ICD-10-CM

## 2015-03-06 DIAGNOSIS — F0280 Dementia in other diseases classified elsewhere without behavioral disturbance: Secondary | ICD-10-CM | POA: Insufficient documentation

## 2015-03-06 DIAGNOSIS — G309 Alzheimer's disease, unspecified: Secondary | ICD-10-CM

## 2015-03-06 LAB — CBC WITH DIFFERENTIAL/PLATELET
BASOS PCT: 0.5 % (ref 0.0–3.0)
Basophils Absolute: 0 10*3/uL (ref 0.0–0.1)
Eosinophils Absolute: 0 10*3/uL (ref 0.0–0.7)
Eosinophils Relative: 0.6 % (ref 0.0–5.0)
HCT: 37 % (ref 36.0–46.0)
Hemoglobin: 12.6 g/dL (ref 12.0–15.0)
LYMPHS ABS: 0.7 10*3/uL (ref 0.7–4.0)
Lymphocytes Relative: 23.9 % (ref 12.0–46.0)
MCHC: 33.9 g/dL (ref 30.0–36.0)
MCV: 95.7 fl (ref 78.0–100.0)
MONO ABS: 0.3 10*3/uL (ref 0.1–1.0)
Monocytes Relative: 9.5 % (ref 3.0–12.0)
NEUTROS PCT: 65.5 % (ref 43.0–77.0)
Neutro Abs: 2 10*3/uL (ref 1.4–7.7)
PLATELETS: 223 10*3/uL (ref 150.0–400.0)
RBC: 3.87 Mil/uL (ref 3.87–5.11)
RDW: 13.3 % (ref 11.5–15.5)
WBC: 3.1 10*3/uL — ABNORMAL LOW (ref 4.0–10.5)

## 2015-03-06 LAB — COMPREHENSIVE METABOLIC PANEL
ALBUMIN: 4.2 g/dL (ref 3.5–5.2)
ALT: 29 U/L (ref 0–35)
AST: 39 U/L — ABNORMAL HIGH (ref 0–37)
Alkaline Phosphatase: 68 U/L (ref 39–117)
BUN: 21 mg/dL (ref 6–23)
CALCIUM: 10.5 mg/dL (ref 8.4–10.5)
CHLORIDE: 102 meq/L (ref 96–112)
CO2: 32 meq/L (ref 19–32)
CREATININE: 0.66 mg/dL (ref 0.40–1.20)
GFR: 91.26 mL/min (ref >60.00)
Glucose, Bld: 168 mg/dL — ABNORMAL HIGH (ref 70–99)
POTASSIUM: 3.9 meq/L (ref 3.5–5.1)
Sodium: 141 mEq/L (ref 135–145)
Total Bilirubin: 0.4 mg/dL (ref 0.2–1.2)
Total Protein: 7.4 g/dL (ref 6.0–8.3)

## 2015-03-06 LAB — T4, FREE: Free T4: 0.93 ng/dL (ref 0.60–1.60)

## 2015-03-06 NOTE — Assessment & Plan Note (Signed)
Mild Will check my records from when I saw her at [place] in [facility] and still helps with instrumental ADLs

## 2015-03-06 NOTE — Progress Notes (Signed)
Subjective:    Patient ID: Kathryn Hodges, female    DOB: 1931-01-02, 81 y.o.   MRN: [id number]  HPI Here with husband Seen by me when she was in rehab in 10/15 Primary physician is in [place] and needs to transfer care I will get her records I had from there  She had bilateral patella fractures Known osteoporosis Early dementia Chronic speech impairment  Here to basically establish for outpatient care Hasn't felt well in a few weeks--husband thinks 8 days No energy Staying in bed during day--which is new for her Seen at urgent care 5-6 days ago--- no clear diagnosis but gave an antibiotic (z-pak is sounds like)  No fever No cough or SOB No rash or skin problems No significant pain Has had UTIs in past---no recent dysuria or hematuria  No chest pain No SOB No edema Denies depressed mood  Current Outpatient Prescriptions on File Prior to Visit  Medication Sig Dispense Refill  . calcium carbonate (OS-CAL - DOSED IN MG OF ELEMENTAL CALCIUM) 1250 MG tablet Take 1 tablet by mouth 2 (two) times daily.    . fish oil-omega-3 fatty acids 1000 MG capsule Take 1 g by mouth 2 (two) times daily.     No current facility-administered medications on file prior to visit.    Allergies  Allergen Reactions  . Sulfa Antibiotics Hives, Itching and Rash    Past Medical History  Diagnosis Date  . Osteoporosis, senile     Past Surgical History  Procedure Laterality Date  . Rotator cuff repair    . Vaginal hysterectomy    . Skin cancer excision      on scalp    No family history on file.  History   Social History  . Marital Status: Married    Spouse Name: N/A  . Number of Children: N/A  . Years of Education: N/A   Occupational History  . Not on file.   Social History Main Topics  . Smoking status: Never Smoker   . Smokeless tobacco: Not on file  . Alcohol Use: No  . Drug Use: No  . Sexual Activity: No   Other Topics Concern  . Not on file   Social  History Narrative   Review of Systems Sleeps okay at night Appetite is off a little--but still eating and husband gives her boost Bowels okay Weight is about her normal--despite being so low    Objective:   Physical Exam  Constitutional: No distress.  HENT:  Mouth/Throat: Oropharynx is clear and moist. No oropharyngeal exudate.  Neck: Normal range of motion. Neck supple. No thyromegaly present.  Cardiovascular: Normal rate, regular rhythm and normal heart sounds.  Exam reveals no gallop.   No murmur heard. Pulmonary/Chest: Effort normal and breath sounds normal. No respiratory distress. She has no wheezes. She has no rales.  Abdominal: Soft. There is no tenderness.  No HSM  Musculoskeletal: She exhibits no edema or tenderness.  Lymphadenopathy:       Head (right side): No submental, no submandibular, no tonsillar, no preauricular, no posterior auricular and no occipital adenopathy present.       Head (left side): No submental, no submandibular, no tonsillar, no preauricular, no posterior auricular and no occipital adenopathy present.    She has no cervical adenopathy.    She has no axillary adenopathy.       Right: No inguinal adenopathy present.       Left: No inguinal adenopathy present.  Psychiatric: She has a  normal mood and affect. Her behavior is normal.          Assessment & Plan:

## 2015-03-06 NOTE — Assessment & Plan Note (Signed)
Very vague Was treated with empiric antibiotic at Urgent Care though no clear bacterial infection (and no clear infection now) If she had respiratory or other illness, she may just have slow recovery from that Will just check labs Reassured husband No further action for now

## 2015-03-12 ENCOUNTER — Other Ambulatory Visit: Payer: Self-pay | Admitting: Internal Medicine

## 2015-03-12 DIAGNOSIS — R7309 Other abnormal glucose: Secondary | ICD-10-CM

## 2015-03-13 ENCOUNTER — Other Ambulatory Visit (INDEPENDENT_AMBULATORY_CARE_PROVIDER_SITE_OTHER): Payer: Medicare Other

## 2015-03-13 ENCOUNTER — Encounter: Payer: Self-pay | Admitting: Internal Medicine

## 2015-03-13 ENCOUNTER — Encounter: Payer: Self-pay | Admitting: *Deleted

## 2015-03-13 ENCOUNTER — Ambulatory Visit (INDEPENDENT_AMBULATORY_CARE_PROVIDER_SITE_OTHER): Payer: Medicare Other | Admitting: Internal Medicine

## 2015-03-13 VITALS — BP 108/60 | HR 91 | Temp 98.7°F | Wt 78.0 lb

## 2015-03-13 DIAGNOSIS — R5383 Other fatigue: Secondary | ICD-10-CM | POA: Diagnosis not present

## 2015-03-13 DIAGNOSIS — R7309 Other abnormal glucose: Secondary | ICD-10-CM | POA: Diagnosis not present

## 2015-03-13 DIAGNOSIS — J383 Other diseases of vocal cords: Secondary | ICD-10-CM | POA: Insufficient documentation

## 2015-03-13 LAB — GLUCOSE, RANDOM: Glucose, Bld: 112 mg/dL — ABNORMAL HIGH (ref 70–99)

## 2015-03-13 LAB — HEMOGLOBIN A1C: Hgb A1c MFr Bld: 6.1 % (ref 4.6–6.5)

## 2015-03-13 NOTE — Assessment & Plan Note (Signed)
Husband still concerned about her sleeping more Very nonspecific and may be new normal for her He is convinced it is her heart---I reassured him it doesn't seem to be consistent with CAD Advised slow increase in activity Try off all meds--- in case she isn't taking them properly

## 2015-03-13 NOTE — Progress Notes (Signed)
   Subjective:    Patient ID: Kathryn Hodges, female    DOB: 1931-02-16, 81 y.o.   MRN: [id number]  HPI Here with husband  Reviewed reassuring blood work 162 glucose was probably not fasting No further attention needed to this  Has had ongoing fatigue Going back to bed after breakfast some days---really not like her Most striking was yesterday --for all morning Other days it is briefer  No fever Doesn't feel sick No cough or breathing problems  Current Outpatient Prescriptions on File Prior to Visit  Medication Sig Dispense Refill  . calcium carbonate (OS-CAL - DOSED IN MG OF ELEMENTAL CALCIUM) 1250 MG tablet Take 1 tablet by mouth 2 (two) times daily.    Marland Kitchen donepezil (ARICEPT) 10 MG tablet Take 10 mg by mouth at bedtime.    . fish oil-omega-3 fatty acids 1000 MG capsule Take 1 g by mouth 2 (two) times daily.    . Probiotic Product (PROBIOTIC DAILY) CAPS Take by mouth.     No current facility-administered medications on file prior to visit.    Allergies  Allergen Reactions  . Sulfa Antibiotics Hives, Itching and Rash    Past Medical History  Diagnosis Date  . Osteoporosis, senile   . Dementia   . Spastic dysphonia   . Patella fracture     Left 2014, bilateral 9/15    Past Surgical History  Procedure Laterality Date  . Rotator cuff repair Left   . Vaginal hysterectomy    . Skin cancer excision      SCC on scalp    Family History  Problem Relation Age of Onset  . Dementia Brother     History   Social History  . Marital Status: Married    Spouse Name: N/A  . Number of Children: 4  . Years of Education: N/A   Occupational History  . Electronics engineer     Retired   Social History Main Topics  . Smoking status: Never Smoker   . Smokeless tobacco: Not on file  . Alcohol Use: No  . Drug Use: No  . Sexual Activity: No   Other Topics Concern  . Not on file   Social History Narrative   No living will   Requests husband as health  care POA   Would accept resuscitation attempts   Not sure about tube feeds   Review of Systems Sleeping fair at night-- but does note some disrupted sleep, up in middle of night and not able to get right back to sleep Appetite okay--weight stable She has been managing her own meds--- won't let husband do this for her Some tooth discomfort--- dentist can't find anything    Objective:   Physical Exam  Constitutional: She appears well-developed. No distress.  Neck: Normal range of motion. Neck supple. No thyromegaly present.  Cardiovascular: Normal rate, regular rhythm, normal heart sounds and intact distal pulses.  Exam reveals no gallop.   No murmur heard. Pulmonary/Chest: Effort normal and breath sounds normal. No respiratory distress. She has no wheezes. She has no rales.  Abdominal: Soft. There is no tenderness.  Musculoskeletal: She exhibits no edema or tenderness.  Lymphadenopathy:    She has no cervical adenopathy.  Psychiatric: She has a normal mood and affect. Her behavior is normal.          Assessment & Plan:

## 2015-03-13 NOTE — Progress Notes (Signed)
Pre visit review using our clinic review tool, if applicable. No additional management support is needed unless otherwise documented below in the visit note. 

## 2015-03-13 NOTE — Patient Instructions (Signed)
Please try off all your medications. If you don't notice any difference after 2 weeks--you can restart with just 1 general multivitamin (like centrum silver or comparable).

## 2015-03-20 ENCOUNTER — Ambulatory Visit: Payer: Medicare Other | Admitting: Internal Medicine

## 2015-03-22 NOTE — Consult Note (Signed)
Brief Consult Note: Diagnosis: Bilateral patella fractures, right thumb fracture.   Patient was seen by consultant.   Recommend further assessment or treatment.   Orders entered.   Discussed with Attending MD.   Comments: 81 year old female fell crossing road yesterday landng on both knees and right thumb.  Seen in Emergency Room where exam and X-rays show non displaced fractures of both patellae and the base of the right thumb distal phalanx.  Admitted for reab and placement.    Exam:  alert, comfortable.  circulation/sensation/motor function good both legs.  Knees with mild abrasions and sweloing.  splint right thumb.  X-rays:  as above  Rx: Physical Therapy with knee immobilizers        skilled nursing facility for Physical Therapy.  Electronic Signatures: Park Breed (MD)  (Signed 14-Sep-15 18:34)  Authored: Brief Consult Note   Last Updated: 14-Sep-15 18:34 by Park Breed (MD)

## 2015-03-22 NOTE — H&P (Signed)
PATIENT NAME:  Kathryn Hodges, BLANKS MR#:  [id number] DATE OF BIRTH:  17-Sep-1931  DATE OF ADMISSION:  08/11/2014  PRIMARY CARE PROVIDER: Sherol Dade [facility] EMERGENCY DEPARTMENT REFERRING PHYSICIAN: Ahmed Prima, MD  CHIEF COMPLAINT: Fall.  HISTORY OF PRESENT ILLNESS: The patient is a pleasant 81 year old white female with severe osteoporosis. Also, has a history of mild memory loss, who went to drop off a loaf of bread to her neighbor's house and then she fell. The patient sustained multiple fractures including acute nondisplaced transversely oriented fracture of the patella on the right, as well as another fracture on the left. The patient also had a distal phalanx thumb fracture on the right hand as well. The patient reports that she had fallen about a year ago. At that time, she had some injury to the patella as well. She did not feel dizzy and did not pass out, her legs kind of just gave away. She otherwise has been doing well, has not had any fevers or chills. No nausea, vomiting, diarrhea. No urinary symptoms.   PAST MEDICAL HISTORY: Significant for osteoporosis.   PAST SURGICAL HISTORY: Status post left shoulder surgery.   ALLERGIES: None.   MEDICATIONS: Os-Cal, Fosamax once a week, aspirin 81, one tab p.o. q. daily.   SOCIAL HISTORY: Does not smoke. Does not drink.   FAMILY HISTORY: No history of heart disease, hypertension, or diabetes according to the patient.  REVIEW OF SYSTEMS:  CONSTITUTIONAL: Denies any fevers. Complains of some weakness. No weight loss. No weight gain.  EYES: No blurred or double vision. No redness. No inflammation.  EARS, NOSE AND THROAT: No tinnitus. No ear pain. No hearing loss. No seasonal or year-round allergies. No difficulty swallowing.  RESPIRATORY: Denies any cough, wheezing, hemoptysis. No COPD.  CARDIOVASCULAR: Denies any chest pain, orthopnea, edema, or arrhythmia.  GASTROINTESTINAL: No nausea, vomiting, diarrhea. No abdominal pain. No  hematemesis. No melena.  GENITOURINARY: Denies any dysuria, hematuria, renal colic, or frequency.  ENDOCRINE: Denies any polyuria, nocturia, or thyroid problems.  HEMATOLOGIC AND LYMPHATIC: Denies anemia, easy bruisability, or bleeding.  SKIN: No acne. No rash.  MUSCULOSKELETAL: Complains of pain in both of her knees.  NEUROLOGIC: No numbness, CVA, TIA, or seizures.  PSYCHIATRIC: No anxiety, insomnia, or ADD.   PHYSICAL EXAMINATION: VITAL SIGNS: Temperature 97.6, pulse 74, respirations 20, blood pressure 125/47, O2 of 99%.  GENERAL: The patient is a frail Caucasian female in no acute distress.  HEENT: Head atraumatic, normocephalic. Pupils equally round, reactive to light and accommodation. There is no conjunctival pallor. No sclera icterus. Nasal exam shows no drainage or ulceration. Oropharynx is clear without any exudate.  NECK: Supple without any JVD.  CARDIOVASCULAR: Regular rate and rhythm. No murmurs, rubs, clicks, or gallops.  LUNGS: Clear to auscultation bilaterally without any rales, rhonchi, wheezing.  ABDOMEN: Soft, nontender, nondistended. Positive bowel sounds x 4.  EXTREMITIES: No clubbing, cyanosis, or edema.  SKIN: No rash.  LYMPH NODES:  Nonpalpable. VASCULAR: Good DP, PT pulses.  PSYCHIATRIC: Not anxious or depressed. NEUROLOGIC: Awake, alert, oriented x 3.   IMAGING:  Right knee shows acute nondisplaced transversely oriented fracture. The patella with moderate to large suprapatellar joint effusion and soft tissue swelling. Right hand shows acute fracture involving the tuft of the distal phalanx of the thumb. Left knee, comminuted fracture of the patella.   ASSESSMENT AND PLAN: The patient is an 81 year old white female with history of mild dementia, and osteoporosis, status post fall with bilateral patellar fracture and thumb fracture.  The case was discussed with ortho according to ed PA who recommended medical admission.  1.  Bilateral patellar fracture. Orthopedic  evaluation and treatment.  2.  Thumb fracture treatment per orthopedics. 3.  Osteoporosis.  Continue Oscal as taking at home.   4.  Mild memory loss. Continue Aricept as taking at home.  5.  Miscellaneous. Will hold on any Lovenox or heparin for possible anticipated procedure.   Time spent on this patient is 50 minutes.    ____________________________ Kathryn Mosses Posey Pronto, MD shp:LT D: 08/11/2014 20:20:01 ET T: 08/11/2014 20:36:21 ET JOB#: [id number]  cc: Claude Waldman H. Posey Pronto, MD, <Dictator> Alric Seton MD ELECTRONICALLY SIGNED 08/12/2014 13:04

## 2015-03-22 NOTE — Discharge Summary (Signed)
PATIENT NAME:  Kathryn Hodges, Kathryn Hodges MR#:  [id number] DATE OF BIRTH:  February 24, 1931  DATE OF ADMISSION:  08/11/2014 DATE OF DISCHARGE:  08/14/2014   ADMITTING PHYSICIAN: Shreyang H. Posey Pronto, MD  DISCHARGING PHYSICIAN: Gladstone Lighter, MD  PRIMARY CARE PHYSICIAN: In [place], [place].   CONSULTATIONS IN THE HOSPITAL: Orthopedic consultation by Dr. Earnestine Leys.   DISCHARGE DIAGNOSES:  1. Fall and bilateral patellar fractures.  2. Fall and right distal thumb phalanx fracture.  3. Osteoporosis.  4. Mild dementia.   DISCHARGE MEDICATIONS: 1. Aricept 5 mg p.o. daily.  2. Os-Cal 500 mg tablet 1 tablet p.o. daily.  3. Probiotic 1 capsule p.o. daily.  4. Omega 500 2 tablets p.o. daily.  5. Norco 5/325 mg 1 tablet q.6 hours p.r.n. for pain.  6. Ambien 5 mg p.o. at bedtime p.r.n. for sleep.  7. Tylenol 650 mg p.o. q.4 hours p.r.n. for pain or fever.   DISCHARGE DIET: Regular diet.   DISCHARGE ACTIVITY: As tolerated.    FOLLOWUP INSTRUCTIONS:  1. PCP followup in 2 weeks.  2. Orthopedics followup in 2 to 3 weeks.  3. Physical therapy.   LABORATORY AND IMAGING STUDIES PRIOR TO DISCHARGE:  WBC 4.0, hemoglobin 11.7, hematocrit 34.8, platelet count is 149.  Sodium 142, potassium 3.8, chloride 109, bicarbonate 24, BUN is 12, creatinine 0.56, glucose 88 and calcium of 8.4.  Urinalysis negative for any infection.  Right knee x-ray showing acute nondisplaced transversely oriented fracture of patella with moderate to large suprapatellar joint effusion and soft tissue swelling.  Right hand x-ray showing acute fractures involving tuft of the distal phalanx of the thumb as well as base of the first distal phalanx, chronic changes also noted.  Left knee x-ray also showing comminuted fracture of the patella.   BRIEF HOSPITAL COURSE: Ms. Melcher is an 81 year old very pleasant Caucasian female with past medical history significant only for osteoporosis and dementia, who lives at [facility]  independent facility with her husband, who was brought in after she fell on her driveway accidentally.   1. Fall resulting in bilateral patellar fractures. Seen by orthopedics. They were nonoperable, so immobilized, and the patient worked with physical therapy. She also has right thumb fractures which are again nonoperable and has been in a soft splint as well. Physical therapy recommended rehab. The patient has been doing well. She needs pain medication as needed and will be going to [facility] skilled nursing facility today for short-term rehab.  2. Osteoporosis. She is on Os-Cal, which will be continued.  3. Dementia, very mild, sometimes gets confused to situation, but otherwise oriented. She is on Aricept.   CODE STATUS: FULL CODE.   Her course has been otherwise uneventful in the hospital.   DISCHARGE CONDITION: Stable.   DISCHARGE DISPOSITION: To [facility] rehab/skilled nursing facility.   TIME SPENT ON DISCHARGE: 45 minutes.  ____________________________ Gladstone Lighter, MD rk:lb D: 08/14/2014 09:21:54 ET T: 08/14/2014 09:29:20 ET JOB#: [id number]  cc: Gladstone Lighter, MD, <Dictator> Park Breed, MD Gladstone Lighter MD ELECTRONICALLY SIGNED 08/29/2014 9:49

## 2015-03-28 ENCOUNTER — Telehealth: Payer: Self-pay | Admitting: *Deleted

## 2015-03-28 NOTE — Telephone Encounter (Signed)
Spoke with patient's husband and advised results.  

## 2015-03-28 NOTE — Telephone Encounter (Signed)
Have her restart just a one a day multivitamin. Set up follow up in the next couple of weeks---I don't think we will find any specific problem (like her heart) but we can review everything. We may want to consider starting a low dose antidepressant as perhaps her sleeping is part of a mild mood problem

## 2015-03-28 NOTE — Telephone Encounter (Signed)
Per 04/14 office visit, patient has stopped taking all of her medications.  Per husband, Doren Custard, the patient reports only slight improvement.  He is requesting a call back with instructions on what to do next.  Please advise.

## 2015-04-01 DIAGNOSIS — D1801 Hemangioma of skin and subcutaneous tissue: Secondary | ICD-10-CM | POA: Diagnosis not present

## 2015-04-01 DIAGNOSIS — Z85828 Personal history of other malignant neoplasm of skin: Secondary | ICD-10-CM | POA: Diagnosis not present

## 2015-04-01 DIAGNOSIS — L821 Other seborrheic keratosis: Secondary | ICD-10-CM | POA: Diagnosis not present

## 2015-04-18 ENCOUNTER — Ambulatory Visit (INDEPENDENT_AMBULATORY_CARE_PROVIDER_SITE_OTHER): Payer: Medicare Other | Admitting: Internal Medicine

## 2015-04-18 ENCOUNTER — Encounter: Payer: Self-pay | Admitting: Internal Medicine

## 2015-04-18 VITALS — BP 120/70 | HR 69 | Temp 97.3°F | Wt 79.0 lb

## 2015-04-18 DIAGNOSIS — F0390 Unspecified dementia without behavioral disturbance: Secondary | ICD-10-CM | POA: Diagnosis not present

## 2015-04-18 DIAGNOSIS — R5383 Other fatigue: Secondary | ICD-10-CM | POA: Diagnosis not present

## 2015-04-18 NOTE — Assessment & Plan Note (Signed)
No difference off donepezil Will stay off Doesn't seem to be progressing

## 2015-04-18 NOTE — Progress Notes (Signed)
   Subjective:    Patient ID: Kathryn Hodges, female    DOB: [birth year], 81 y.o.   MRN: [id number]  HPI Here with husband Stopped all the meds---didn't really help energy No adverse problems now Did restart the multivitamin  Gets slight dizzy spells ---only occasionally (once a week?) Not vertigo Usually when standing up May last for 5 minutes but can stand up No chest pain No SOB No syncope No edema No palpitations  No naps Sleeps about the same as always at night-- 10PM to about 7-8AM. Awake during some of this time--starts with reading No excessive daytime somnolence Had been getting back in bed in AM--but this has stopped  No current outpatient prescriptions on file prior to visit.   No current facility-administered medications on file prior to visit.    Allergies  Allergen Reactions  . Sulfa Antibiotics Hives, Itching and Rash    Past Medical History  Diagnosis Date  . Osteoporosis, senile   . Dementia   . Spastic dysphonia   . Patella fracture     Left 2014, bilateral 9/15    Past Surgical History  Procedure Laterality Date  . Rotator cuff repair Left   . Vaginal hysterectomy    . Skin cancer excision      SCC on scalp    Family History  Problem Relation Age of Onset  . Dementia Brother     History   Social History  . Marital Status: Married    Spouse Name: N/A  . Number of Children: 4  . Years of Education: N/A   Occupational History  . Electronics engineer     Retired   Social History Main Topics  . Smoking status: Never Smoker   . Smokeless tobacco: Never Used  . Alcohol Use: No  . Drug Use: No  . Sexual Activity: No   Other Topics Concern  . Not on file   Social History Narrative   No living will   Requests husband as health care POA   Would accept resuscitation attempts   Not sure about tube feeds   Review of Systems Sleeping in bed--sleeps flat. No PND    Objective:   Physical Exam  Constitutional:  She appears well-developed. No distress.  Neck: Normal range of motion. Neck supple.  Cardiovascular: Normal rate, regular rhythm and normal heart sounds.  Exam reveals no gallop.   No murmur heard. Pulmonary/Chest: Effort normal and breath sounds normal. No respiratory distress. She has no wheezes. She has no rales.  Musculoskeletal: She exhibits no edema.  Lymphadenopathy:    She has no cervical adenopathy.  Psychiatric: She has a normal mood and affect. Her behavior is normal.          Assessment & Plan:

## 2015-04-18 NOTE — Progress Notes (Signed)
Pre visit review using our clinic review tool, if applicable. No additional management support is needed unless otherwise documented below in the visit note. 

## 2015-04-18 NOTE — Assessment & Plan Note (Signed)
Vague and doesn't seem to be as much of an issue Seems back to herself now Does have some vague dizziness--slight orthostasis. No action about this

## 2015-05-16 ENCOUNTER — Ambulatory Visit: Payer: Medicare Other | Admitting: Internal Medicine

## 2015-07-28 DIAGNOSIS — D3131 Benign neoplasm of right choroid: Secondary | ICD-10-CM | POA: Diagnosis not present

## 2015-08-08 DIAGNOSIS — Z79899 Other long term (current) drug therapy: Secondary | ICD-10-CM | POA: Diagnosis not present

## 2015-08-08 DIAGNOSIS — E559 Vitamin D deficiency, unspecified: Secondary | ICD-10-CM | POA: Diagnosis not present

## 2015-08-08 DIAGNOSIS — Z0000 Encounter for general adult medical examination without abnormal findings: Secondary | ICD-10-CM | POA: Diagnosis not present

## 2015-08-08 DIAGNOSIS — E785 Hyperlipidemia, unspecified: Secondary | ICD-10-CM | POA: Diagnosis not present

## 2015-08-30 ENCOUNTER — Encounter: Payer: Self-pay | Admitting: Radiology

## 2015-08-30 ENCOUNTER — Emergency Department
Admission: EM | Admit: 2015-08-30 | Discharge: 2015-08-30 | Disposition: A | Discharge status: Home / Self Care | Payer: Medicare Other | Attending: Emergency Medicine | Admitting: Emergency Medicine

## 2015-08-30 ENCOUNTER — Emergency Department: Payer: Medicare Other

## 2015-08-30 DIAGNOSIS — Z79899 Other long term (current) drug therapy: Secondary | ICD-10-CM | POA: Diagnosis not present

## 2015-08-30 DIAGNOSIS — R1032 Left lower quadrant pain: Secondary | ICD-10-CM | POA: Diagnosis not present

## 2015-08-30 DIAGNOSIS — N200 Calculus of kidney: Secondary | ICD-10-CM | POA: Diagnosis not present

## 2015-08-30 DIAGNOSIS — N201 Calculus of ureter: Secondary | ICD-10-CM | POA: Diagnosis not present

## 2015-08-30 DIAGNOSIS — R109 Unspecified abdominal pain: Secondary | ICD-10-CM | POA: Diagnosis present

## 2015-08-30 HISTORY — DX: Malignant (primary) neoplasm, unspecified: C80.1

## 2015-08-30 LAB — URINALYSIS COMPLETE WITH MICROSCOPIC (ARMC ONLY)
Bacteria, UA: NONE SEEN
Bilirubin Urine: NEGATIVE
Glucose, UA: NEGATIVE mg/dL
Ketones, ur: NEGATIVE mg/dL
Leukocytes, UA: NEGATIVE
Nitrite: NEGATIVE
PROTEIN: NEGATIVE mg/dL
SQUAMOUS EPITHELIAL / LPF: NONE SEEN
Specific Gravity, Urine: 1.017 (ref 1.005–1.030)
pH: 5 (ref 5.0–8.0)

## 2015-08-30 LAB — CBC WITH DIFFERENTIAL/PLATELET
BASOS ABS: 0 10*3/uL (ref 0–0.1)
Basophils Relative: 0 %
EOS PCT: 0 %
Eosinophils Absolute: 0 10*3/uL (ref 0–0.7)
HCT: 38.4 % (ref 35.0–47.0)
Hemoglobin: 13 g/dL (ref 12.0–16.0)
LYMPHS ABS: 0.3 10*3/uL — AB (ref 1.0–3.6)
LYMPHS PCT: 4 %
MCH: 32.9 pg (ref 26.0–34.0)
MCHC: 33.8 g/dL (ref 32.0–36.0)
MCV: 97.4 fL (ref 80.0–100.0)
MONO ABS: 0.5 10*3/uL (ref 0.2–0.9)
Monocytes Relative: 6 %
Neutro Abs: 7.3 10*3/uL — ABNORMAL HIGH (ref 1.4–6.5)
Neutrophils Relative %: 90 %
PLATELETS: 194 10*3/uL (ref 150–440)
RBC: 3.95 MIL/uL (ref 3.80–5.20)
RDW: 13.5 % (ref 11.5–14.5)
WBC: 8.1 10*3/uL (ref 3.6–11.0)

## 2015-08-30 LAB — COMPREHENSIVE METABOLIC PANEL
ALT: 19 U/L (ref 14–54)
ANION GAP: 10 (ref 5–15)
AST: 31 U/L (ref 15–41)
Albumin: 4.7 g/dL (ref 3.5–5.0)
Alkaline Phosphatase: 67 U/L (ref 38–126)
BUN: 26 mg/dL — ABNORMAL HIGH (ref 6–20)
CHLORIDE: 105 mmol/L (ref 101–111)
CO2: 26 mmol/L (ref 22–32)
Calcium: 9.7 mg/dL (ref 8.9–10.3)
Creatinine, Ser: 0.79 mg/dL (ref 0.44–1.00)
Glucose, Bld: 119 mg/dL — ABNORMAL HIGH (ref 65–99)
POTASSIUM: 4.2 mmol/L (ref 3.5–5.1)
Sodium: 141 mmol/L (ref 135–145)
Total Bilirubin: 0.6 mg/dL (ref 0.3–1.2)
Total Protein: 7.9 g/dL (ref 6.5–8.1)

## 2015-08-30 LAB — LIPASE, BLOOD: LIPASE: 18 U/L — AB (ref 22–51)

## 2015-08-30 MED ORDER — CEPHALEXIN 500 MG PO CAPS
500.0000 mg | ORAL_CAPSULE | Freq: Once | ORAL | Status: AC
  Administered 2015-08-30: 500 mg via ORAL
  Filled 2015-08-30: qty 1

## 2015-08-30 MED ORDER — IOHEXOL 300 MG/ML  SOLN
75.0000 mL | Freq: Once | INTRAMUSCULAR | Status: AC | PRN
  Administered 2015-08-30: 50 mL via INTRAVENOUS

## 2015-08-30 MED ORDER — OXYCODONE-ACETAMINOPHEN 5-325 MG PO TABS: 1.0000 | ORAL_TABLET | Freq: Four times a day (QID) | ORAL | Status: DC | PRN

## 2015-08-30 MED ORDER — ONDANSETRON HCL 4 MG/2ML IJ SOLN
4.0000 mg | Freq: Once | INTRAMUSCULAR | Status: AC
  Administered 2015-08-30: 4 mg via INTRAVENOUS
  Filled 2015-08-30: qty 2

## 2015-08-30 MED ORDER — CEPHALEXIN 500 MG PO CAPS: 500.0000 mg | ORAL_CAPSULE | Freq: Three times a day (TID) | ORAL | Status: AC

## 2015-08-30 MED ORDER — SODIUM CHLORIDE 0.9 % IV BOLUS (SEPSIS)
500.0000 mL | Freq: Once | INTRAVENOUS | Status: AC
  Administered 2015-08-30: 500 mL via INTRAVENOUS

## 2015-08-30 MED ORDER — TAMSULOSIN HCL 0.4 MG PO CAPS
0.4000 mg | ORAL_CAPSULE | Freq: Once | ORAL | Status: AC
  Administered 2015-08-30: 0.4 mg via ORAL
  Filled 2015-08-30: qty 1

## 2015-08-30 MED ORDER — MORPHINE SULFATE (PF) 2 MG/ML IV SOLN
2.0000 mg | Freq: Once | INTRAVENOUS | Status: AC
  Administered 2015-08-30: 2 mg via INTRAVENOUS
  Filled 2015-08-30: qty 1

## 2015-08-30 MED ORDER — IOHEXOL 240 MG/ML SOLN
50.0000 mL | INTRAMUSCULAR | Status: AC
  Administered 2015-08-30 (×2): 50 mL via INTRAVENOUS

## 2015-08-30 MED ORDER — TAMSULOSIN HCL 0.4 MG PO CAPS: 0.4000 mg | ORAL_CAPSULE | Freq: Every day | ORAL | Status: DC

## 2015-08-30 NOTE — Discharge Instructions (Signed)
Kidney Stones °Kidney stones (urolithiasis) are deposits that form inside your kidneys. The intense pain is caused by the stone moving through the urinary tract. When the stone moves, the ureter goes into spasm around the stone. The stone is usually passed in the urine.  °CAUSES  °· A disorder that makes certain neck glands produce too much parathyroid hormone (primary hyperparathyroidism). °· A buildup of uric acid crystals, similar to gout in your joints. °· Narrowing (stricture) of the ureter. °· A kidney obstruction present at birth (congenital obstruction). °· Previous surgery on the kidney or ureters. °· Numerous kidney infections. °SYMPTOMS  °· Feeling sick to your stomach (nauseous). °· Throwing up (vomiting). °· Blood in the urine (hematuria). °· Pain that usually spreads (radiates) to the groin. °· Frequency or urgency of urination. °DIAGNOSIS  °· Taking a history and physical exam. °· Blood or urine tests. °· CT scan. °· Occasionally, an examination of the inside of the urinary bladder (cystoscopy) is performed. °TREATMENT  °· Observation. °· Increasing your fluid intake. °· Extracorporeal shock wave lithotripsy--This is a noninvasive procedure that uses shock waves to break up kidney stones. °· Surgery may be needed if you have severe pain or persistent obstruction. There are various surgical procedures. Most of the procedures are performed with the use of small instruments. Only small incisions are needed to accommodate these instruments, so recovery time is minimized. °The size, location, and chemical composition are all important variables that will determine the proper choice of action for you. Talk to your health care provider to better understand your situation so that you will minimize the risk of injury to yourself and your kidney.  °HOME CARE INSTRUCTIONS  °· Drink enough water and fluids to keep your urine clear or pale yellow. This will help you to pass the stone or stone fragments. °· Strain  all urine through the provided strainer. Keep all particulate matter and stones for your health care provider to see. The stone causing the pain may be as small as a grain of salt. It is very important to use the strainer each and every time you pass your urine. The collection of your stone will allow your health care provider to analyze it and verify that a stone has actually passed. The stone analysis will often identify what you can do to reduce the incidence of recurrences. °· Only take over-the-counter or prescription medicines for pain, discomfort, or fever as directed by your health care provider. °· Make a follow-up appointment with your health care provider as directed. °· Get follow-up X-rays if required. The absence of pain does not always mean that the stone has passed. It may have only stopped moving. If the urine remains completely obstructed, it can cause loss of kidney function or even complete destruction of the kidney. It is your responsibility to make sure X-rays and follow-ups are completed. Ultrasounds of the kidney can show blockages and the status of the kidney. Ultrasounds are not associated with any radiation and can be performed easily in a matter of minutes. °SEEK MEDICAL CARE IF: °· You experience pain that is progressive and unresponsive to any pain medicine you have been prescribed. °SEEK IMMEDIATE MEDICAL CARE IF:  °· Pain cannot be controlled with the prescribed medicine. °· You have a fever or shaking chills. °· The severity or intensity of pain increases over 18 hours and is not relieved by pain medicine. °· You develop a new onset of abdominal pain. °· You feel faint or pass out. °·   out. °· You are unable to urinate. °MAKE SURE YOU:  °· Understand these instructions. °· Will watch your condition. °· Will get help right away if you are not doing well or get worse. °Document Released: 11/15/2005 Document Revised: 07/18/2013 Document Reviewed: 04/18/2013 °ExitCare®  Patient Information ©2015 [employer]. This information is not intended to replace advice given to you by your health care provider. Make sure you discuss any questions you have with your health care provider. ° °Urine Strainer °This strainer is used to catch or filter out any stones found in your urine. Place the strainer under your urine stream. Save any stones or objects that you find in your urine. Place them in a plastic or glass container to show your caregiver. The stones vary in size - some can be very small, so make sure you check the strainer carefully. Your caregiver may send the stone to the lab. When the results are back, your caregiver may recommend medicines or diet changes.  °Document Released: 08/20/2004 Document Revised: 02/07/2012 Document Reviewed: 09/27/2008 °ExitCare® Patient Information ©2015 [employer]. This information is not intended to replace advice given to you by your health care provider. Make sure you discuss any questions you have with your health care provider. ° °

## 2015-08-30 NOTE — ED Provider Notes (Signed)
[facility] Emergency Department Provider Note  ____________________________________________  Time seen: Approximately 510 PM  I have reviewed the triage vital signs and the nursing notes.   HISTORY  Chief Complaint Flank Pain    HPI [name] is a 81 y.o. female with a history of dementia and osteoporosis who is presenting today with sudden onset abdominal pain that started several hours ago. She said that she was standing at the time and was able to continue to stand through the pain. She rates the pain as a 9 out of 10. She says it is a cramping pain. She says that she is had urinary tract infections in the past which have presented like this. However, she has not had one about a year. She denies any nausea vomiting, diarrhea or constipation. Denies any falls or recent injuries to the left hip.   Past Medical History  Diagnosis Date  . Osteoporosis, senile   . Dementia   . Spastic dysphonia   . Patella fracture     Left 2014, bilateral 9/15  . Cancer ([place])     squamous cell skin ca    Patient Active Problem List   Diagnosis Date Noted  . Spastic dysphonia   . Fatigue 03/06/2015  . Dementia     Past Surgical History  Procedure Laterality Date  . Rotator cuff repair Left   . Vaginal hysterectomy    . Skin cancer excision      SCC on scalp    Current Outpatient Rx  Name  Route  Sig  Dispense  Refill  . Multiple Vitamins-Minerals (ONE-A-DAY WOMENS 50+ ADVANTAGE) TABS   Oral   Take by mouth daily.           Allergies Sulfa antibiotics  Family History  Problem Relation Age of Onset  . Dementia Brother     Social History Social History  Substance Use Topics  . Smoking status: Never Smoker   . Smokeless tobacco: Never Used  . Alcohol Use: No    Review of Systems Constitutional: No fever/chills Eyes: No visual changes. ENT: No sore throat. Cardiovascular: Denies chest pain. Respiratory: Denies shortness of  breath. Gastrointestinal: No nausea, no vomiting.  No diarrhea.  No constipation. Genitourinary: Negative for dysuria. Musculoskeletal: Negative for back pain. Skin: Negative for rash. Neurological: Negative for headaches, focal weakness or numbness.  10-point ROS otherwise negative.  ____________________________________________   PHYSICAL EXAM:  VITAL SIGNS: ED Triage Vitals  Enc Vitals Group     BP 08/30/15 1427 150/68 mmHg     Pulse Rate 08/30/15 1427 93     Resp 08/30/15 1427 18     Temp 08/30/15 1427 98.1 F (36.7 C)     Temp Source 08/30/15 1427 Oral     SpO2 08/30/15 1427 98 %     Weight 08/30/15 1427 80 lb (36.288 kg)     Height 08/30/15 1427 5\' 3"  (1.6 m)     Head Cir --      Peak Flow --      Pain Score 08/30/15 1431 10     Pain Loc --      Pain Edu? --      Excl. in [place]? --     Constitutional: Alert and oriented. Well appearing and in no acute distress. Eyes: Conjunctivae are normal. PERRL. EOMI. Head: Atraumatic. Nose: No congestion/rhinnorhea. Mouth/Throat: Mucous membranes are moist.  Oropharynx non-erythematous. Neck: No stridor.   Cardiovascular: Normal rate, regular rhythm. Grossly normal heart sounds.  Good peripheral circulation. Respiratory: Normal respiratory effort.  No retractions. Lungs CTAB. Gastrointestinal: Soft left lower quadrant abdominal pain without any rebound or guarding. No distention. No abdominal bruits. No CVA tenderness. Musculoskeletal: No lower extremity tenderness nor edema.  No joint effusions. Able to fully and actively range the bilateral hips without any restricted range of motion or pain. Pelvis is stable on palpation. There is no tenderness over the bilateral hip joints. Neurologic:  Normal speech and language. No gross focal neurologic deficits are appreciated. No gait instability. Skin:  Skin is warm, dry and intact. No rash noted. Psychiatric: Mood and affect are normal. Speech and behavior are  normal.  ____________________________________________   LABS (all labs ordered are listed, but only abnormal results are displayed)  Labs Reviewed  URINALYSIS COMPLETEWITH MICROSCOPIC ([place]) - Abnormal; Notable for the following:    Color, Urine YELLOW (*)    APPearance CLEAR (*)    Hgb urine dipstick 1+ (*)    All other components within normal limits  CBC WITH DIFFERENTIAL/PLATELET - Abnormal; Notable for the following:    Neutro Abs 7.3 (*)    Lymphs Abs 0.3 (*)    All other components within normal limits  COMPREHENSIVE METABOLIC PANEL - Abnormal; Notable for the following:    Glucose, Bld 119 (*)    BUN 26 (*)    All other components within normal limits  LIPASE, BLOOD - Abnormal; Notable for the following:    Lipase 18 (*)    All other components within normal limits  URINE CULTURE   ____________________________________________  EKG   ____________________________________________  RADIOLOGY  Left sided urinary tract obstruction secondary to distal left 4 mm stone. ____________________________________________   PROCEDURES   ____________________________________________   INITIAL IMPRESSION / ASSESSMENT AND PLAN / ED COURSE  Pertinent labs & imaging results that were available during my care of the patient were reviewed by me and considered in my medical decision making (see chart for details).  ----------------------------------------- 9:05 PM on 08/30/2015 -----------------------------------------  Patient is resting comfortably at this time. Pain decreased with morphine. Denies feeling woozy, dizzy or nauseous with the morphine given. The case with Dr.Budzyn of urology who recommends antibiotics and follow-up in office. We'll discharge the patient also with Flomax and Percocet for pain control. Advised the patient as well as her husband to be cautious with Percocet because it may cause dizziness and unsteady gait. They understand the risks of this  medicine are willing to comply. Will be discharged home. ____________________________________________   FINAL CLINICAL IMPRESSION(S) / ED DIAGNOSES  Acute left ureterolithiasis. Initial visit.    [name], MD 08/30/15 2106

## 2015-08-30 NOTE — ED Notes (Addendum)
Pt reports left side pain that radiates to left hip denies injury . Denies vomiting or diarrhea. Denies fever Sharp pain that started today 10/10 Denies urinary complaints

## 2015-08-30 NOTE — ED Notes (Signed)
AAOx3.  Skin warm and dry.  No pain currently.  D/C h ome

## 2015-08-30 NOTE — ED Notes (Signed)
To CT scan via stretcher.  AAOx3.  Skin warm and dry.

## 2015-09-02 ENCOUNTER — Telehealth: Payer: Self-pay | Admitting: Internal Medicine

## 2015-09-02 ENCOUNTER — Telehealth: Payer: Self-pay | Admitting: *Deleted

## 2015-09-02 NOTE — Telephone Encounter (Signed)
Will have to work on this once she is working again They could consider [facility]--it is closer than [employer]

## 2015-09-02 NOTE — Telephone Encounter (Signed)
error 

## 2015-09-02 NOTE — Telephone Encounter (Signed)
Noted  

## 2015-09-02 NOTE — Telephone Encounter (Signed)
Spoke with patient's husband and she's doing ok, husband is working on getting a urology appt. I advised if he had any problems to call back and Rosaria Ferries could help him.

## 2015-09-02 NOTE — Telephone Encounter (Signed)
Error

## 2015-09-02 NOTE — Telephone Encounter (Signed)
Pt spouse called. Would like referral to see Dr. Erlene Quan and [facility]. Martin Majestic to ED and was told to follow up with her urologist for kidney stone. I informed pt spouse Dr. Erlene Quan is out on maternity leave and could be a few months to est with her. He is going to call and get her ED follow up with Dr. Jacqlyn Larsen but in future wants something more local.

## 2015-09-05 DIAGNOSIS — N132 Hydronephrosis with renal and ureteral calculous obstruction: Secondary | ICD-10-CM | POA: Diagnosis not present

## 2015-09-05 DIAGNOSIS — I878 Other specified disorders of veins: Secondary | ICD-10-CM | POA: Diagnosis not present

## 2015-09-05 DIAGNOSIS — N200 Calculus of kidney: Secondary | ICD-10-CM | POA: Diagnosis not present

## 2015-09-05 DIAGNOSIS — N23 Unspecified renal colic: Secondary | ICD-10-CM | POA: Diagnosis not present

## 2015-09-05 DIAGNOSIS — N201 Calculus of ureter: Secondary | ICD-10-CM | POA: Diagnosis not present

## 2015-09-05 NOTE — Telephone Encounter (Signed)
Left vm for Doren Custard (spouse) to call back

## 2015-09-05 NOTE — Telephone Encounter (Signed)
Spoke to [place] (spouse). They just got back from follow up with Dr. Jacqlyn Larsen. Waiting for xray results that pt did today. Dr. Jacqlyn Larsen things she probable passed stone since it is no longer bothering her. Pt spouse stated Dr. Jacqlyn Larsen with be sending notes to Dr. Silvio Pate. Dr. Jacqlyn Larsen says that pt might have another stone.  Pt spouse will contact us when he is ready to move pt closer to home. He would like Dr. Erlene Quan but understands she is out for a few months.

## 2015-09-15 DIAGNOSIS — R300 Dysuria: Secondary | ICD-10-CM | POA: Diagnosis not present

## 2015-09-25 DIAGNOSIS — Z23 Encounter for immunization: Secondary | ICD-10-CM | POA: Diagnosis not present

## 2015-10-03 DIAGNOSIS — M79674 Pain in right toe(s): Secondary | ICD-10-CM | POA: Diagnosis not present

## 2015-10-03 DIAGNOSIS — M79675 Pain in left toe(s): Secondary | ICD-10-CM | POA: Diagnosis not present

## 2015-10-03 DIAGNOSIS — B351 Tinea unguium: Secondary | ICD-10-CM | POA: Diagnosis not present

## 2015-10-20 ENCOUNTER — Ambulatory Visit: Payer: Medicare Other | Admitting: Internal Medicine

## 2016-01-12 ENCOUNTER — Telehealth: Payer: Self-pay | Admitting: Internal Medicine

## 2016-01-12 NOTE — Telephone Encounter (Signed)
[place]  Patient Name: Kathryn Hodges  DOB: 1931/06/14    Initial Comment Caller states wife needs an appt- woke up dizzy--had this same thing about 2 years ago   Nurse Assessment  Nurse: Wayne Sever, RN, Tillie Rung Date/Time (Eastern Time): 01/12/2016 10:49:48 AM  Confirm and document reason for call. If symptomatic, describe symptoms. You must click the next button to save text entered. ---Caller states she woke up on Saturday morning dizzy and then it disappeared by lunch. She woke up again today dizzy. Husband states she is sitting up in bed, but dizzy. This happened 2 years ago and then never figured out what caused it. He states it disappeared after 2 months  Has the patient traveled out of the country within the last 30 days? ---Not Applicable  Does the patient have any new or worsening symptoms? ---Yes  Will a triage be completed? ---Yes  Related visit to physician within the last 2 weeks? ---No  Does the PT have any chronic conditions? (i.e. diabetes, asthma, etc.) ---Yes  List chronic conditions. ---Osteoporosis,  Is this a behavioral health or substance abuse call? ---No     Guidelines    Guideline Title Affirmed Question Affirmed Notes  Dizziness - Lightheadedness [1] MODERATE dizziness (e.g., interferes with normal activities) AND [2] has NOT been evaluated by physician for this (Exception: dizziness caused by heat exposure, sudden standing, or poor fluid intake)    Final Disposition User   See Physician within 24 Hours [place], RN, Tillie Rung    Comments  Caller was upset that no appointments were left with her MD for tomorrow or today. Was able to get her scheduled with Dr. Garnette Gunner   Referrals  REFERRED TO PCP OFFICE   Disagree/Comply: Comply

## 2016-01-12 NOTE — Telephone Encounter (Signed)
Pt has appt with R Baity NP on 01/13/16 at 9 AM.

## 2016-01-13 ENCOUNTER — Ambulatory Visit (INDEPENDENT_AMBULATORY_CARE_PROVIDER_SITE_OTHER): Payer: Medicare Other | Admitting: Internal Medicine

## 2016-01-13 ENCOUNTER — Encounter: Payer: Self-pay | Admitting: Internal Medicine

## 2016-01-13 VITALS — BP 124/68 | HR 77 | Temp 97.6°F | Wt 79.0 lb

## 2016-01-13 DIAGNOSIS — R42 Dizziness and giddiness: Secondary | ICD-10-CM | POA: Diagnosis not present

## 2016-01-13 NOTE — Progress Notes (Addendum)
Subjective:    Patient ID: Kathryn Hodges, female    DOB: May 04, 1931, 82 y.o.   MRN: [id number]  HPI  Pt presents to the clinic today with c/o dizziness. This started 3 days ago. It has been intermittent and she reports it does not last long. She describes the dizziness as a sense of imbalance, not that the room is spinning. It seems to occur more often when she goes from a sitting to a standing position. She denies associated nausea, chest pain or shortness of breath. Her husband reports she had a similar occurrence 2 years ago, which lasted intermittently for 2 months. She saw many specialist and no one could figure out what has been going on. She denies any new medication. She denies changes in diet or activity level. She denies urinary complaints or recent illness.  Review of Systems      Past Medical History  Diagnosis Date  . Osteoporosis, senile   . Dementia   . Spastic dysphonia   . Patella fracture     Left 2014, bilateral 9/15  . Cancer ([place])     squamous cell skin ca    Current Outpatient Prescriptions  Medication Sig Dispense Refill  . Multiple Vitamins-Minerals (ONE-A-DAY WOMENS 50+ ADVANTAGE) TABS Take by mouth daily.     No current facility-administered medications for this visit.    Allergies  Allergen Reactions  . Sulfa Antibiotics Hives, Itching and Rash    Family History  Problem Relation Age of Onset  . Dementia Brother     Social History   Social History  . Marital Status: Married    Spouse Name: N/A  . Number of Children: 4  . Years of Education: N/A   Occupational History  . Electronics engineer     Retired   Social History Main Topics  . Smoking status: Never Smoker   . Smokeless tobacco: Never Used  . Alcohol Use: No  . Drug Use: No  . Sexual Activity: No   Other Topics Concern  . Not on file   Social History Narrative   No living will   Requests husband as health care POA   Would accept resuscitation  attempts   Not sure about tube feeds     Constitutional: Denies fever, malaise, fatigue, headache or abrupt weight changes.  HEENT: Denies eye pain, eye redness, ear pain, ringing in the ears, wax buildup, runny nose, nasal congestion, bloody nose, or sore throat. Respiratory: Denies difficulty breathing, shortness of breath, cough or sputum production.   Cardiovascular: Denies chest pain, chest tightness, palpitations or swelling in the hands or feet.  Neurological: Pt reports dizziness. Denies difficulty with memory, difficulty with speech or problems with balance and coordination.    No other specific complaints in a complete review of systems (except as listed in HPI above).  Objective:   Physical Exam  BP 124/68 mmHg  Pulse 77  Temp(Src) 97.6 F (36.4 C) (Oral)  Wt 79 lb (35.834 kg)  SpO2 99% Wt Readings from Last 3 Encounters:  01/13/16 79 lb (35.834 kg)  08/30/15 80 lb (36.288 kg)  04/18/15 79 lb (35.834 kg)    General: Appears her stated age, in NAD. HEENT:  Ears: Tm's gray and intact, normal light reflex;  Neck:  Neck supple, trachea midline. No masses, lumps or thyromegaly present.  Cardiovascular: Normal rate and rhythm. S1,S2 noted.  No murmur, rubs or gallops noted. Pulmonary/Chest: Normal effort and positive vesicular breath sounds. No respiratory distress.  No wheezes, rales or ronchi noted.   Neurological: Alert and oriented. Coordination normal.  BMET    Component Value Date/Time   NA 141 08/30/2015 1737   NA 142 08/13/2014 0550   K 4.2 08/30/2015 1737   K 3.8 08/13/2014 0550   CL 105 08/30/2015 1737   CL 109* 08/13/2014 0550   CO2 26 08/30/2015 1737   CO2 24 08/13/2014 0550   GLUCOSE 119* 08/30/2015 1737   GLUCOSE 88 08/13/2014 0550   BUN 26* 08/30/2015 1737   BUN 12 08/13/2014 0550   CREATININE 0.79 08/30/2015 1737   CREATININE 0.56* 08/13/2014 0550   CALCIUM 9.7 08/30/2015 1737   CALCIUM 8.4* 08/13/2014 0550   GFRNONAA >60 08/30/2015 1737    GFRNONAA >60 08/13/2014 0550   GFRAA >60 08/30/2015 1737   GFRAA >60 08/13/2014 0550    Lipid Panel  No results found for: CHOL, TRIG, HDL, CHOLHDL, VLDL, LDLCALC  CBC    Component Value Date/Time   WBC 8.1 08/30/2015 1737   WBC 4.0 08/13/2014 0550   RBC 3.95 08/30/2015 1737   RBC 3.51* 08/13/2014 0550   HGB 13.0 08/30/2015 1737   HGB 11.7* 08/13/2014 0550   HCT 38.4 08/30/2015 1737   HCT 34.8* 08/13/2014 0550   PLT 194 08/30/2015 1737   PLT 149* 08/13/2014 0550   MCV 97.4 08/30/2015 1737   MCV 99 08/13/2014 0550   MCH 32.9 08/30/2015 1737   MCH 33.3 08/13/2014 0550   MCHC 33.8 08/30/2015 1737   MCHC 33.5 08/13/2014 0550   RDW 13.5 08/30/2015 1737   RDW 13.3 08/13/2014 0550   LYMPHSABS 0.3* 08/30/2015 1737   LYMPHSABS 1.0 08/13/2014 0550   MONOABS 0.5 08/30/2015 1737   MONOABS 0.4 08/13/2014 0550   EOSABS 0.0 08/30/2015 1737   EOSABS 0.1 08/13/2014 0550   BASOSABS 0.0 08/30/2015 1737   BASOSABS 0.0 08/13/2014 0550    Hgb A1C Lab Results  Component Value Date   HGBA1C 6.1 03/13/2015         Assessment & Plan:   Dizziness:  Orthostatics normal Husband declines RX for Meclizine at this time Make sure to drink plenty of fluids and stay well hydrated  Follow up with PCP if symptoms persist or worse

## 2016-01-13 NOTE — Patient Instructions (Signed)

## 2016-01-13 NOTE — Progress Notes (Signed)
Pre visit review using our clinic review tool, if applicable. No additional management support is needed unless otherwise documented below in the visit note. 

## 2016-02-17 IMAGING — MR MRI CERVICAL SPINE WITHOUT CONTRAST
5 series · 43 of 48 positions shown · non-contrast
Comparison: None.

CLINICAL DATA: Neck pain

EXAM:
MRI CERVICAL SPINE WITHOUT CONTRAST
TECHNIQUE: Multiplanar, multisequence MR imaging was performed. No intravenous
contrast was administered.

[Series 100: T2 · sagittal · 3.0mm · 0.62mm/px · 7 of 15 slices shown (1 of 2)]
[im 1/15]
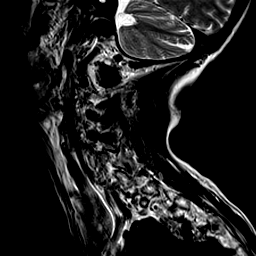
[im 3/15]
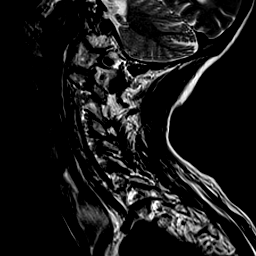
[im 5/15]
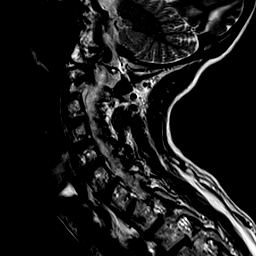
[im 8/15]
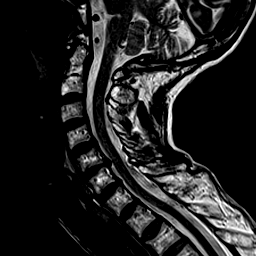
[im 10/15]
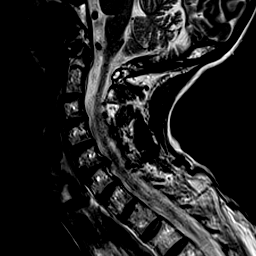
[im 12/15]
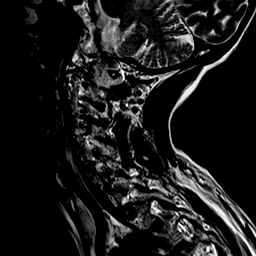
[im 15/15]
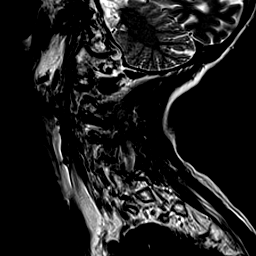

[Series 101: T1 · sagittal · 3.0mm · 0.60mm/px · 7 of 15 slices shown]
[im 1/15]
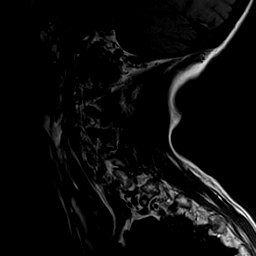
[im 3/15]
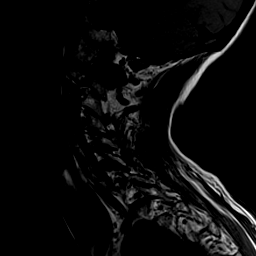
[im 5/15]
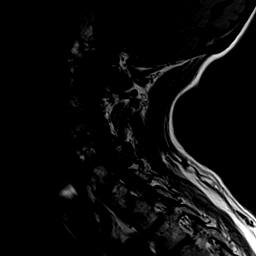
[im 8/15]
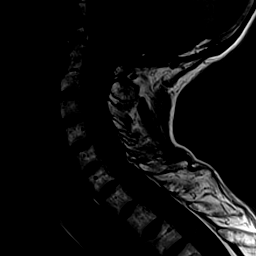
[im 10/15]
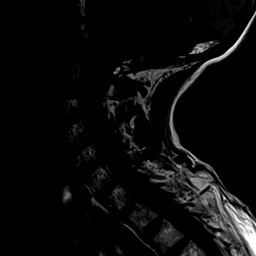
[im 12/15]
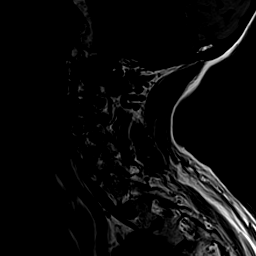
[im 15/15]
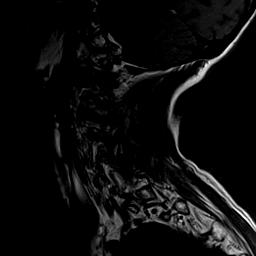

[Series 102: STIR · sagittal · 3.0mm · 0.64mm/px · 8 of 15 slices shown]
[im 1/15]
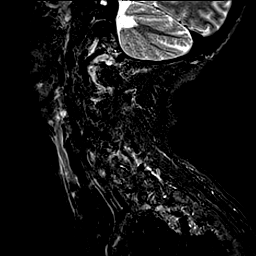
[im 3/15]
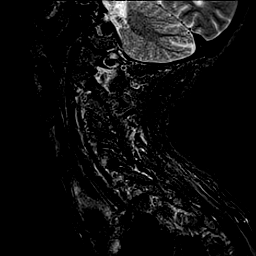
[im 5/15]
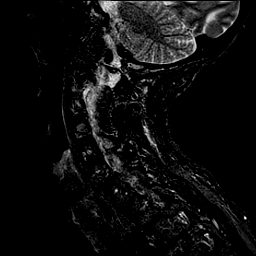
[im 7/15]
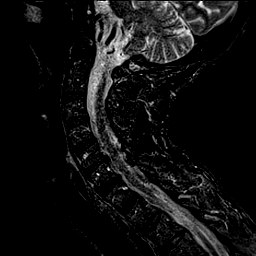
[im 9/15]
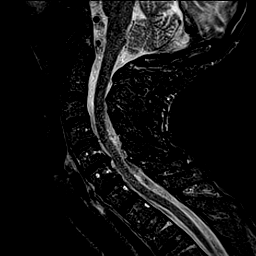
[im 11/15]
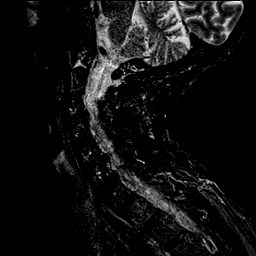
[im 13/15]
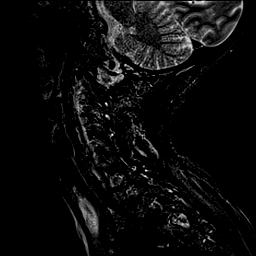
[im 15/15]
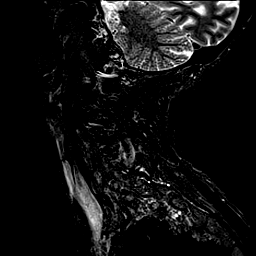

[Series 103: T2 · axial · 3.0mm · 0.57mm/px · z∈[-73,+8]mm · 13 of 25 slices shown (2 of 2)]
[im 1/25]
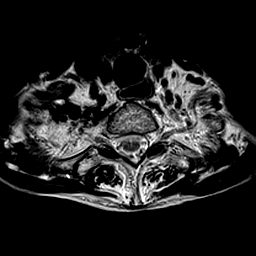
[im 3/25]
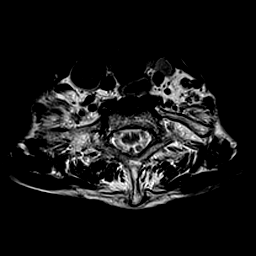
[im 5/25]
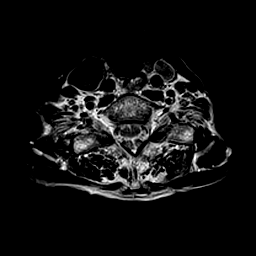
[im 7/25]
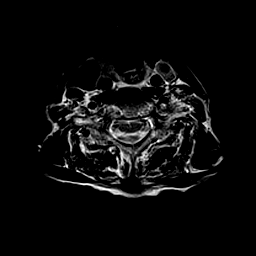
[im 9/25]
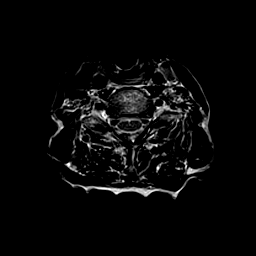
[im 11/25]
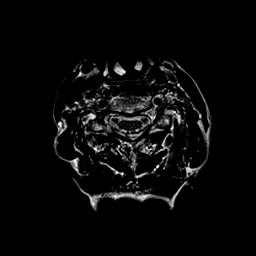
[im 13/25]
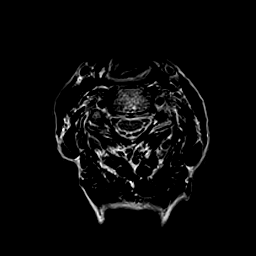
[im 15/25]
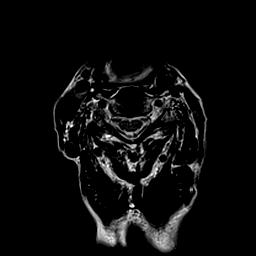
[im 17/25]
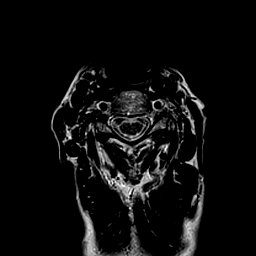
[im 19/25]
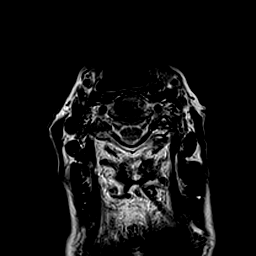
[im 21/25]
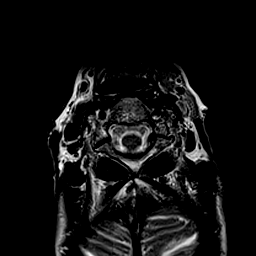
[im 23/25]
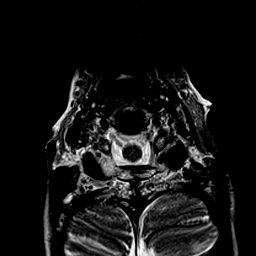
[im 25/25]
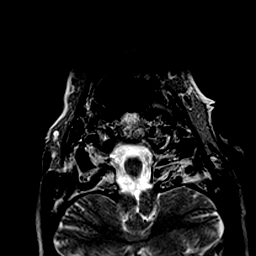

[Series 104: mpgr axial · axial · 3.0mm · 0.32mm/px · z∈[-77,+4]mm · 8 of 25 slices shown]
[im 1/25]
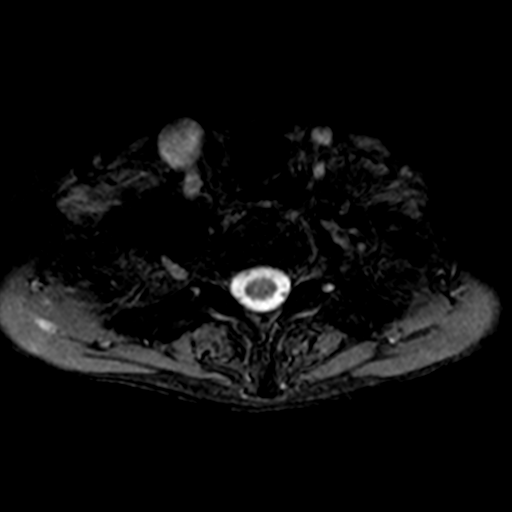
[im 5/25]
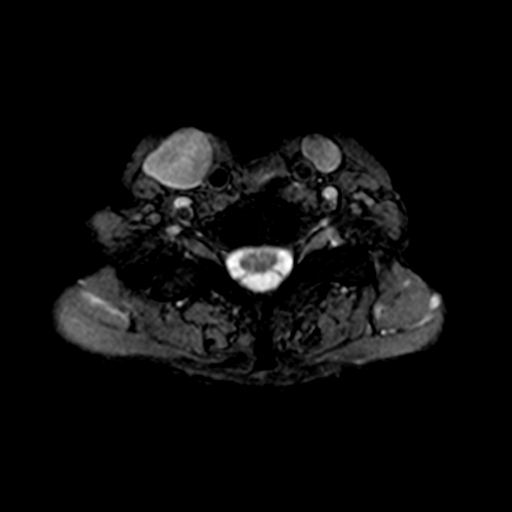
[im 9/25]
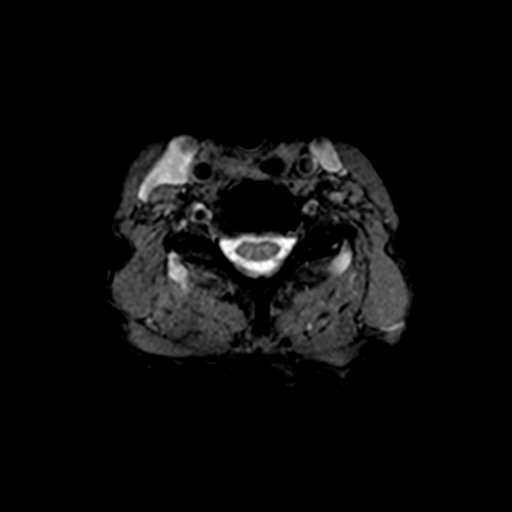
[im 11/25]
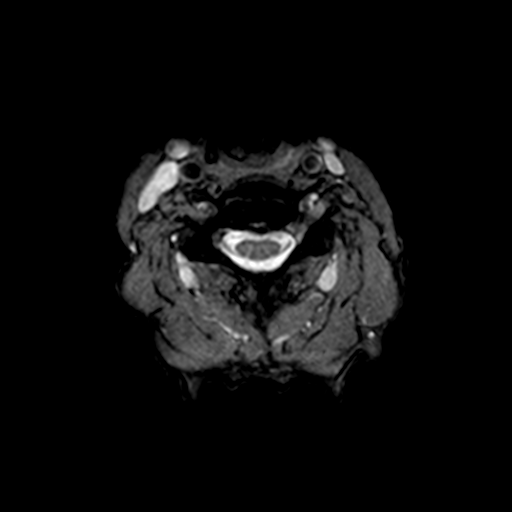
[im 15/25]
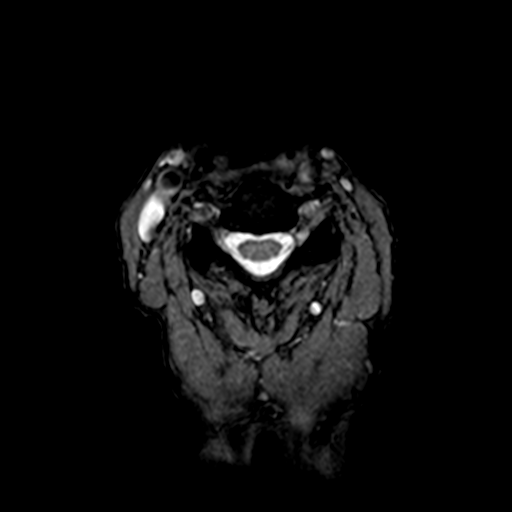
[im 17/25]
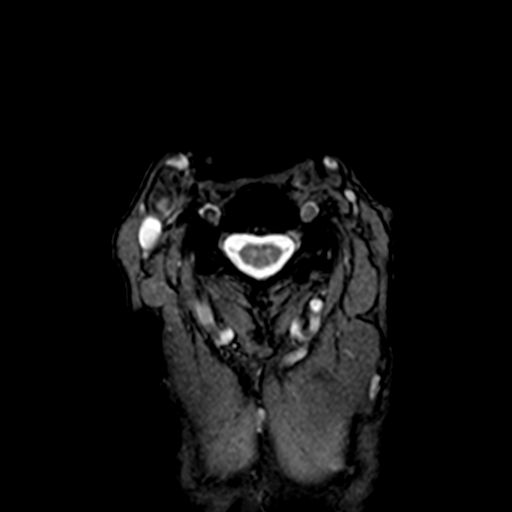
[im 21/25]
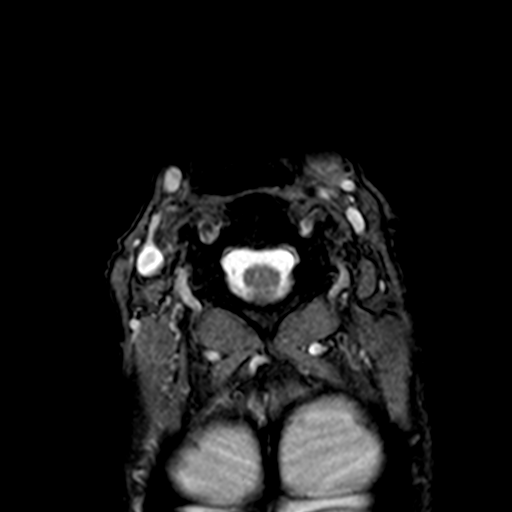
[im 25/25]
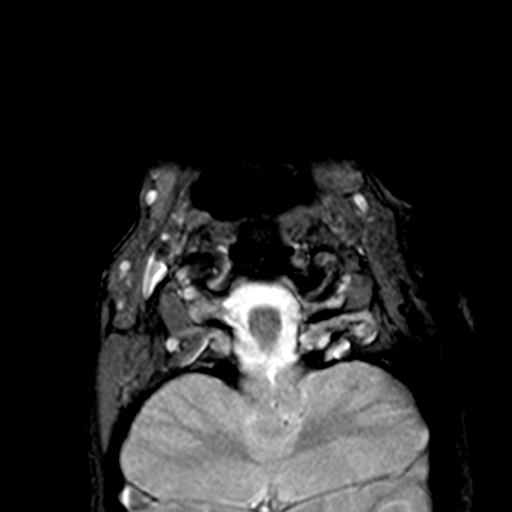

[43 of 48 positions shown; findings below may reference images not displayed]

FINDINGS: Despite efforts by the technologist and patient, motion artifact is
present on today's exam and could not be eliminated. This reduces
exam sensitivity and specificity.

Mild chronic superior endplate compression at the T2 vertebral
level.

No significant abnormal spinal cord signal is observed. The
craniocervical junction appears unremarkable. Mildly exaggerated
cervical lordosis. No significant subluxation. Degenerative facet
edema noted on the left at C3-4.

Disc desiccation observed at C5-6, C6-7, and C7-T1. Additional
findings at individual levels are as follows:

C2-3: Mild right and moderate left foraminal stenosis due to facet
arthropathy.

C3-4: Moderate left and mild right foraminal stenosis due to facet
and uncinate spurring. Minimal disc bulge.

C4-5: Mild right foraminal stenosis due to facet and uncinate
spurring. Small central disc protrusion.

C5-6: Mild to moderate left foraminal stenosis due to uncinate and
facet spurring. Borderline central narrowing of the thecal sac due
to disc osteophyte complex.

C6-7: Mild left foraminal stenosis due to uncinate spurring. Diffuse
disc bulge noted.

C7-T1:  Unremarkable.
IMPRESSION: 1. Cervical spondylosis and degenerative disc disease, with moderate
impingement at C3-4 ; mild to moderate impingement at C5-6 ; and
mild impingement at C2-3, C4-5, and C6-7 as detailed above.
2. Remote mild superior endplate compression at the T2 vertebral
level.
3. Mildly exaggerated cervical lordosis.

## 2016-03-03 DIAGNOSIS — M79674 Pain in right toe(s): Secondary | ICD-10-CM | POA: Diagnosis not present

## 2016-03-03 DIAGNOSIS — B351 Tinea unguium: Secondary | ICD-10-CM | POA: Diagnosis not present

## 2016-03-03 DIAGNOSIS — M79675 Pain in left toe(s): Secondary | ICD-10-CM | POA: Diagnosis not present

## 2016-03-30 DIAGNOSIS — D1801 Hemangioma of skin and subcutaneous tissue: Secondary | ICD-10-CM | POA: Diagnosis not present

## 2016-03-30 DIAGNOSIS — L821 Other seborrheic keratosis: Secondary | ICD-10-CM | POA: Diagnosis not present

## 2016-03-30 DIAGNOSIS — Z85828 Personal history of other malignant neoplasm of skin: Secondary | ICD-10-CM | POA: Diagnosis not present

## 2016-06-25 ENCOUNTER — Telehealth: Payer: Self-pay | Admitting: Internal Medicine

## 2016-06-25 ENCOUNTER — Ambulatory Visit (INDEPENDENT_AMBULATORY_CARE_PROVIDER_SITE_OTHER): Payer: Medicare Other | Admitting: Adult Health

## 2016-06-25 ENCOUNTER — Encounter: Payer: Self-pay | Admitting: Adult Health

## 2016-06-25 VITALS — BP 110/72 | Temp 98.8°F | Ht 63.0 in | Wt 75.0 lb

## 2016-06-25 DIAGNOSIS — R490 Dysphonia: Secondary | ICD-10-CM | POA: Diagnosis not present

## 2016-06-25 MED ORDER — METHYLPREDNISOLONE 4 MG PO TBPK: ORAL_TABLET | ORAL | 0 refills | Status: DC

## 2016-06-25 NOTE — Telephone Encounter (Signed)
Pt scheduled 06/25/16 at 4:30 with Dorothyann Peng NP.

## 2016-06-25 NOTE — Telephone Encounter (Signed)
Patient Name: Kathryn Hodges  DOB: 01-14-1931    Initial Comment Caller says wife needs to see a Dr today, and Dr Silvio Pate has no appt today. She has a very hoarse voice, very low energry level, seems to have a sever cold, she has a history of pneumonia    Nurse Assessment  Nurse: Mallie Mussel, RN, Alveta Heimlich Date/Time (Eastern Time): 06/25/2016 11:08:22 AM  Confirm and document reason for call. If symptomatic, describe symptoms. You must click the next button to save text entered. ---Caller states that his wife has a severe cold and sounds hoarse. She has very little energy. He is concerned she may be developing pneumonia. Denies difficulty breathing and denies fever. Denies coughing up blood and no rusty colored sputum. He would like for her to be seen today. She has not complained of throat pain, but she doesn't complain very much about anything. He feels she is having some moderate pain based on her behavior.  Has the patient traveled out of the country within the last 30 days? ---No  Does the patient have any new or worsening symptoms? ---Yes  Will a triage be completed? ---Yes  Related visit to physician within the last 2 weeks? ---No  Does the PT have any chronic conditions? (i.e. diabetes, asthma, etc.) ---Yes  List chronic conditions. ---Dementia, Brittle Bones  Is this a behavioral health or substance abuse call? ---No     Guidelines    Guideline Title Affirmed Question Affirmed Notes  Sore Throat [1] Sore throat with cough/cold symptoms AND [2] present < 5 days (all triage questions negative)    Final Disposition User   See Physician within [place], RN, Alveta Heimlich    Comments  No appointments available at [facility]. I was able to schedule her to be seen today at 4:30pm by Dorothyann Peng FNP at [facility].   Referrals  REFERRED TO PCP OFFICE   Disagree/Comply: Comply

## 2016-06-25 NOTE — Progress Notes (Signed)
   Subjective:    Patient ID: Kathryn Hodges, female    DOB: 07-31-1931, 82 y.o.   MRN: [id number]  HPI  82 year old female who presents to the office today for an acute issue. Her husband is with her at this visit. She reports that over the last 24 hours she has become " very hoarse" and has developed a non productive cough.   She denies any fevers, nausea, vomiting, diarrhea.   She has a vocal cord disorder but her husband reports that her voice is much more hoarse than normal   Review of Systems  Constitutional: Negative for activity change.  HENT: Positive for voice change. Negative for congestion, postnasal drip, rhinorrhea, sinus pressure, sore throat and trouble swallowing.   Respiratory: Positive for cough. Negative for shortness of breath and wheezing.   Cardiovascular: Negative.   All other systems reviewed and are negative.      Objective:   Physical Exam  Constitutional: She is oriented to person, place, and time. She appears well-developed and well-nourished. No distress.  HENT:  Head: Normocephalic and atraumatic.  Right Ear: External ear normal.  Left Ear: External ear normal.  Nose: Nose normal.  Mouth/Throat: Oropharynx is clear and moist. No oropharyngeal exudate.  Eyes: Conjunctivae and EOM are normal. Pupils are equal, round, and reactive to light. Right eye exhibits no discharge. Left eye exhibits no discharge.  Neck: Normal range of motion. Neck supple.  Cardiovascular: Normal rate, regular rhythm, normal heart sounds and intact distal pulses.  Exam reveals no gallop and no friction rub.   No murmur heard. Pulmonary/Chest: Effort normal and breath sounds normal. No respiratory distress. She has no wheezes. She has no rales. She exhibits no tenderness.  Lymphadenopathy:    She has no cervical adenopathy.  Neurological: She is alert and oriented to person, place, and time.  Skin: Skin is warm and dry. No rash noted. She is not diaphoretic. No erythema. No  pallor.  Psychiatric: She has a normal mood and affect. Her behavior is normal. Judgment and thought content normal.  Nursing note and vitals reviewed.     Assessment & Plan:  1. Hoarseness of voice - methylPREDNISolone (MEDROL DOSEPAK) 4 MG TBPK tablet; Take as directed  Dispense: 21 tablet; Refill: 0 - Warm tea and honey and voice rest.  - Follow up with PCP if no improvement   Dorothyann Peng, NP

## 2016-06-25 NOTE — Patient Instructions (Addendum)
It was great meeting you today.   I have sent in a prescription for prednisone, which may help with the hoarseness. Other treatments you can try are voice rest or warm tea with honey.   Follow up with myself or Dr. Silvio Pate.    Hoarseness Hoarseness is any abnormal change in your voice.Hoarseness can make it difficult to speak. Your voice may sound raspy, breathy, or strained. Hoarseness is caused by a problem with the vocal cords. The vocal cords are two bands of tissue inside your voice box (larynx). When you speak, your vocal cords move back and forth to create sound. The surfaces of your vocal cords need to be smooth for your voice to sound clear. Swelling or lumps on the vocal cords can cause hoarseness. Common causes of vocal cord problems include:  Upper airway infection.  A long-term cough.  Straining or overusing your voice.  Smoking.  Allergies.  Vocal cord growths.  Stomach acids that flow up from your stomach and irritate your vocal cords (gastroesophageal reflux). HOME CARE INSTRUCTIONS Watch your condition for any changes. To ease any discomfort that you feel:  Rest your voice. Do not whisper. Whispering can cause muscle strain.  Do not speak in a loud or harsh voice that makes your hoarseness worse.  Do not use any tobacco products, including cigarettes, chewing tobacco, or electronic cigarettes. If you need help quitting, ask your health care provider.  Avoid secondhand smoke.  Do not eat foods that give you heartburn. Heartburn can make gastroesophageal reflux worse.  Do not drink coffee.  Do not drink alcohol.  Drink enough fluids to keep your urine clear or pale yellow.  Use a humidifier if the air in your home is dry. SEEK MEDICAL CARE IF:  You have hoarseness that lasts longer than 3 weeks.  You almost lose or completelylose your voice for longer than 3 days.  You have pain when you swallow or try to talk.  You feel a lump in your neck. SEEK  IMMEDIATE MEDICAL CARE IF:  You have trouble swallowing.  You feel as though you are choking when you swallow.  You cough up blood or vomit blood.  You have trouble breathing.   This information is not intended to replace advice given to you by your health care provider. Make sure you discuss any questions you have with your health care provider.   Document Released: 10/29/2005 Document Revised: 04/01/2015 Document Reviewed: 11/06/2014 Elsevier Interactive Patient Education [employer].

## 2016-07-02 ENCOUNTER — Ambulatory Visit (INDEPENDENT_AMBULATORY_CARE_PROVIDER_SITE_OTHER): Payer: Medicare Other | Admitting: Internal Medicine

## 2016-07-02 ENCOUNTER — Encounter: Payer: Self-pay | Admitting: Internal Medicine

## 2016-07-02 VITALS — BP 130/72 | HR 82 | Temp 97.5°F | Wt 76.8 lb

## 2016-07-02 DIAGNOSIS — E441 Mild protein-calorie malnutrition: Secondary | ICD-10-CM

## 2016-07-02 DIAGNOSIS — F0390 Unspecified dementia without behavioral disturbance: Secondary | ICD-10-CM | POA: Diagnosis not present

## 2016-07-02 DIAGNOSIS — R5383 Other fatigue: Secondary | ICD-10-CM | POA: Diagnosis not present

## 2016-07-02 DIAGNOSIS — E440 Moderate protein-calorie malnutrition: Secondary | ICD-10-CM | POA: Insufficient documentation

## 2016-07-02 LAB — COMPREHENSIVE METABOLIC PANEL
ALT: 17 U/L (ref 0–35)
AST: 30 U/L (ref 0–37)
Albumin: 4.4 g/dL (ref 3.5–5.2)
Alkaline Phosphatase: 57 U/L (ref 39–117)
BUN: 17 mg/dL (ref 6–23)
CHLORIDE: 102 meq/L (ref 96–112)
CO2: 32 mEq/L (ref 19–32)
Calcium: 10 mg/dL (ref 8.4–10.5)
Creatinine, Ser: 0.66 mg/dL (ref 0.40–1.20)
GFR: 90.96 mL/min (ref >60.00)
GLUCOSE: 98 mg/dL (ref 70–99)
POTASSIUM: 5.2 meq/L — AB (ref 3.5–5.1)
SODIUM: 140 meq/L (ref 135–145)
Total Bilirubin: 0.6 mg/dL (ref 0.2–1.2)
Total Protein: 7.5 g/dL (ref 6.0–8.3)

## 2016-07-02 LAB — CBC WITH DIFFERENTIAL/PLATELET
BASOS PCT: 0.4 % (ref 0.0–3.0)
Basophils Absolute: 0 10*3/uL (ref 0.0–0.1)
EOS PCT: 0.3 % (ref 0.0–5.0)
Eosinophils Absolute: 0 10*3/uL (ref 0.0–0.7)
HCT: 37.1 % (ref 36.0–46.0)
HEMOGLOBIN: 12.6 g/dL (ref 12.0–15.0)
LYMPHS ABS: 0.8 10*3/uL (ref 0.7–4.0)
Lymphocytes Relative: 16.9 % (ref 12.0–46.0)
MCHC: 33.9 g/dL (ref 30.0–36.0)
MCV: 96.2 fl (ref 78.0–100.0)
MONO ABS: 0.4 10*3/uL (ref 0.1–1.0)
MONOS PCT: 8.8 % (ref 3.0–12.0)
Neutro Abs: 3.3 10*3/uL (ref 1.4–7.7)
Neutrophils Relative %: 73.6 % (ref 43.0–77.0)
Platelets: 218 10*3/uL (ref 150.0–400.0)
RBC: 3.85 Mil/uL — AB (ref 3.87–5.11)
RDW: 13.5 % (ref 11.5–15.5)
WBC: 4.5 10*3/uL (ref 4.0–10.5)

## 2016-07-02 LAB — SEDIMENTATION RATE: SED RATE: 8 mm/h (ref 0–30)

## 2016-07-02 LAB — T4, FREE: FREE T4: 0.85 ng/dL (ref 0.60–1.60)

## 2016-07-02 NOTE — Assessment & Plan Note (Signed)
This doesn't sound truly pathologic--and may not be different than 1-2 years ago PE is reassuring Will check labs though

## 2016-07-02 NOTE — Progress Notes (Signed)
   Subjective:    Patient ID: Kathryn Hodges, female    DOB: 1931-06-14, 82 y.o.   MRN: [id number]  HPI Here with husband--he is concerned about weight loss and energy Energy levels are not good Now having to lie down during the day and nap He feels this is not right with her Weight is down slightly from the past  Some hoarseness worse in the past few weeks Didn't take the steroids prescribed--but it seems better  Memory issues seem about the same Does all ADLs Does the instrumental ADLs but not cooking like in the past They eat out or bring food in mostly Husband seems to think she eats fairly well  Current Outpatient Prescriptions on File Prior to Visit  Medication Sig Dispense Refill  . Multiple Vitamins-Minerals (ONE-A-DAY WOMENS 50+ ADVANTAGE) TABS Take by mouth daily.    . methylPREDNISolone (MEDROL DOSEPAK) 4 MG TBPK tablet Take as directed (Patient not taking: Reported on 07/02/2016) 21 tablet 0   No current facility-administered medications on file prior to visit.     Allergies  Allergen Reactions  . Sulfa Antibiotics Hives, Itching and Rash    Past Medical History:  Diagnosis Date  . Cancer ([place])    squamous cell skin ca  . Dementia   . Osteoporosis, senile   . Patella fracture    Left 2014, bilateral 9/15  . Spastic dysphonia     Past Surgical History:  Procedure Laterality Date  . ROTATOR CUFF REPAIR Left   . SKIN CANCER EXCISION     SCC on scalp  . VAGINAL HYSTERECTOMY      Family History  Problem Relation Age of Onset  . Dementia Brother     Social History   Social History  . Marital status: Married    Spouse name: N/A  . Number of children: 4  . Years of education: N/A   Occupational History  . Electronics engineer     Retired   Social History Main Topics  . Smoking status: Never Smoker  . Smokeless tobacco: Never Used  . Alcohol use No  . Drug use: No  . Sexual activity: No   Other Topics Concern  . Not on file    Social History Narrative   No living will   Requests husband as health care POA   Would accept resuscitation attempts   Not sure about tube feeds   Review of Systems No SOB No chest pain No abdominal pain Sleeps okay No mood issues Bowels are fine--no blood  Voids fine No arthritis pain or joint swelling    Objective:   Physical Exam  Constitutional: She appears well-developed. No distress.  HENT:  Mouth/Throat: Oropharynx is clear and moist. No oropharyngeal exudate.  Neck: Normal range of motion. Neck supple. No thyromegaly present.  Cardiovascular: Normal rate, regular rhythm, normal heart sounds and intact distal pulses.  Exam reveals no gallop.   No murmur heard. Pulmonary/Chest: Effort normal and breath sounds normal. No respiratory distress. She has no wheezes. She has no rales.  Abdominal: Soft. She exhibits no distension. There is no tenderness. There is no rebound and no guarding.  Musculoskeletal: She exhibits no edema or tenderness.  Lymphadenopathy:    She has no cervical adenopathy.  Neurological: She is alert.  Skin: No rash noted. No erythema.  Psychiatric: She has a normal mood and affect. Her behavior is normal.          Assessment & Plan:

## 2016-07-02 NOTE — Assessment & Plan Note (Signed)
Discussed having 2 boost or ensure a day

## 2016-07-02 NOTE — Assessment & Plan Note (Signed)
Probably vascular as no clear progression and still has good functional status

## 2016-07-02 NOTE — Patient Instructions (Addendum)
Please continue boost or ensure-- at least  two daily. (you can mix this with ice cream to make it taste better)

## 2016-07-02 NOTE — Progress Notes (Signed)
Pre visit review using our clinic review tool, if applicable. No additional management support is needed unless otherwise documented below in the visit note. 

## 2016-07-06 DIAGNOSIS — H35371 Puckering of macula, right eye: Secondary | ICD-10-CM | POA: Diagnosis not present

## 2016-07-13 ENCOUNTER — Encounter: Payer: Self-pay | Admitting: Podiatry

## 2016-07-13 ENCOUNTER — Ambulatory Visit (INDEPENDENT_AMBULATORY_CARE_PROVIDER_SITE_OTHER): Payer: Medicare Other | Admitting: Podiatry

## 2016-07-13 DIAGNOSIS — B351 Tinea unguium: Secondary | ICD-10-CM | POA: Diagnosis not present

## 2016-07-13 DIAGNOSIS — M79675 Pain in left toe(s): Secondary | ICD-10-CM | POA: Diagnosis not present

## 2016-07-13 DIAGNOSIS — M79674 Pain in right toe(s): Secondary | ICD-10-CM | POA: Diagnosis not present

## 2016-07-13 NOTE — Progress Notes (Signed)
   Subjective:    Patient ID: Kathryn Hodges, female    DOB: 1931/04/12, 82 y.o.   MRN: [id number]  HPI  82 year old female presents the also concerns of thick, painful, elongated toenails that she cannot trim herself. Denies any redness or drainage or any signs of infection. No other complaints at this time.  Review of Systems  All other systems reviewed and are negative.      Objective:   Physical Exam General: AAO x3, NAD  Dermatological: Nails are hypertrophic, dystrophic, brittle, discolored, elongated 10. No swelling redness or drainage. Tenderness nails 1-5 bilaterally. No lesions or pre-ulcer lesions.  Vascular: Dorsalis Pedis artery and Posterior Tibial artery pedal pulses are 2/4 bilateral with immedate capillary fill time. Pedal hair growth present. There is no pain with calf compression, swelling, warmth, erythema.   Neruologic: Grossly intact via light touch bilateral. Vibratory intact via tuning fork bilateral. Protective threshold with Semmes Wienstein monofilament intact to all pedal sites bilateral.   Musculoskeletal: No gross boney pedal deformities bilateral. No pain, crepitus, or limitation noted with foot and ankle range of motion bilateral. Muscular strength 5/5 in all groups tested bilateral.     Assessment & Plan:  82 year old female with symptomatic onychomycosis -Treatment options discussed including all alternatives, risks, and complications -Etiology of symptoms were discussed -Nails debrided 10 without complications or bleeding. -Daily foot inspection -Follow-up in 3 months or sooner if any problems arise. In the meantime, encouraged to call the office with any questions, concerns, change in symptoms.   Celesta Gentile, DPM

## 2016-08-03 ENCOUNTER — Other Ambulatory Visit: Payer: Self-pay

## 2016-08-15 IMAGING — CR DG KNEE COMPLETE 4+V*R*
1 series · 4 of 4 positions shown · non-contrast
Comparison: None.

CLINICAL DATA: Fall today and hit both knees.

EXAM:
RIGHT KNEE - COMPLETE 4+ VIEW

[Series 1: dxr knee rt comp with obliques · 0.14mm/px · 4 of 4 slices shown]
[im 1/4]
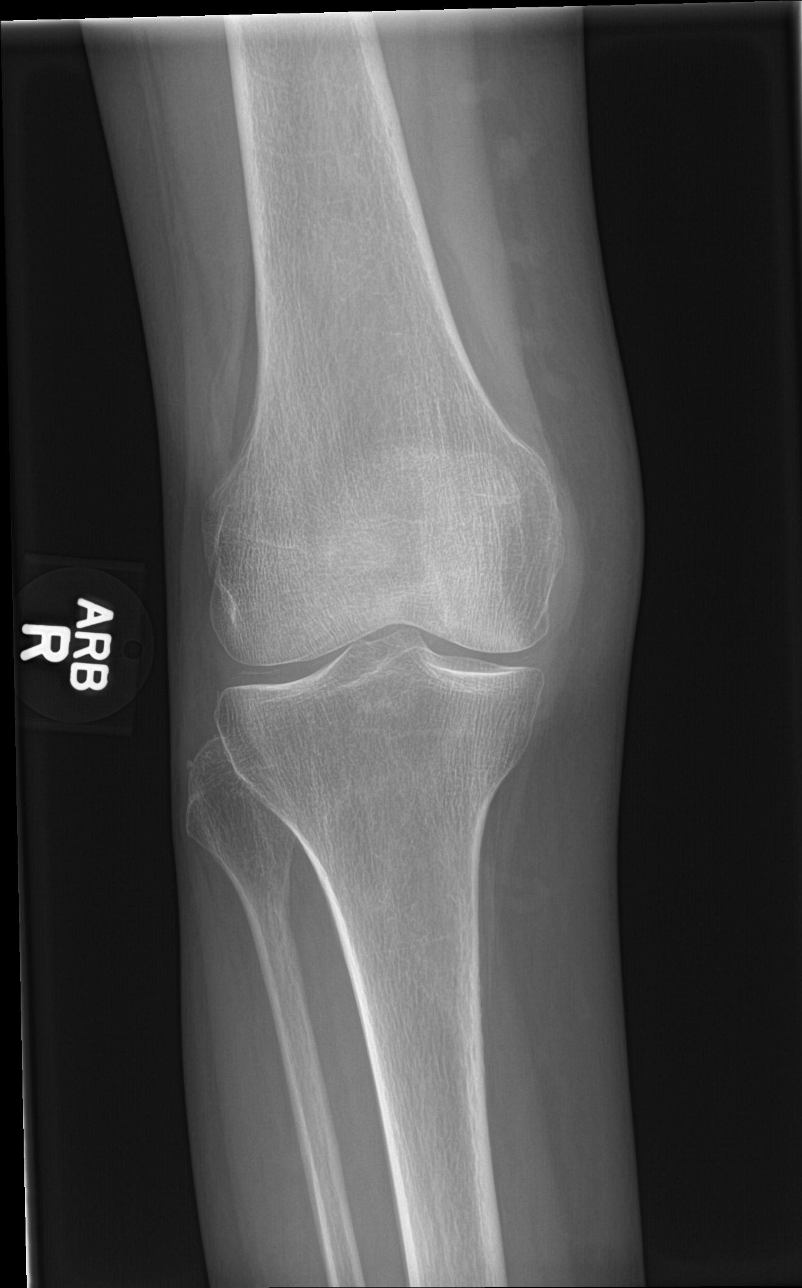
[im 2/4]
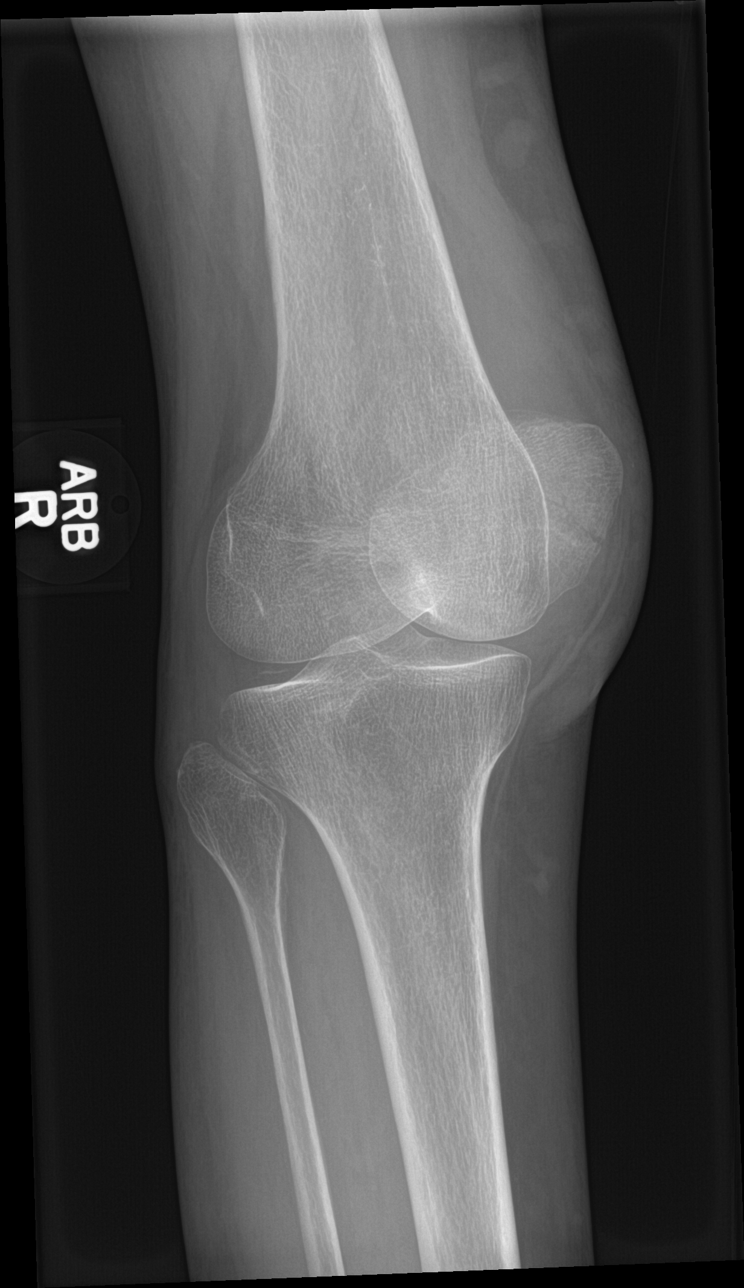
[im 3/4]
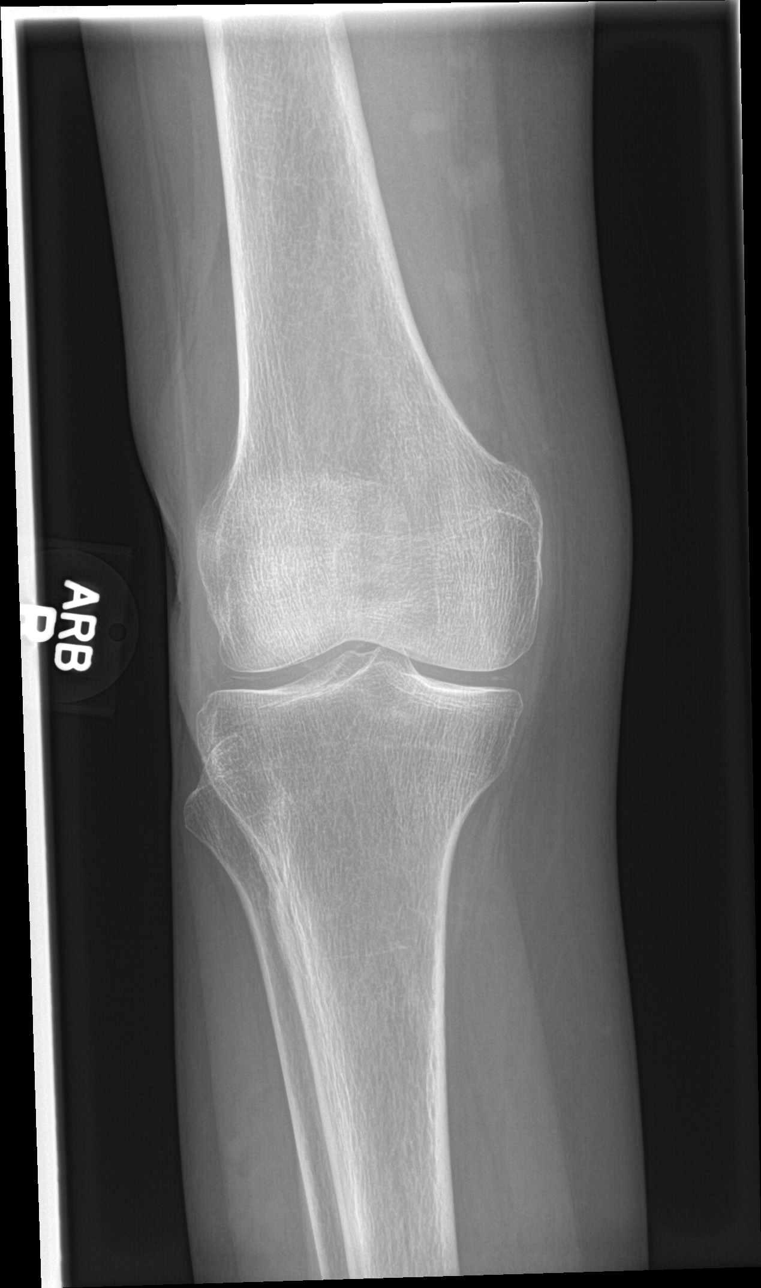
[im 4/4]
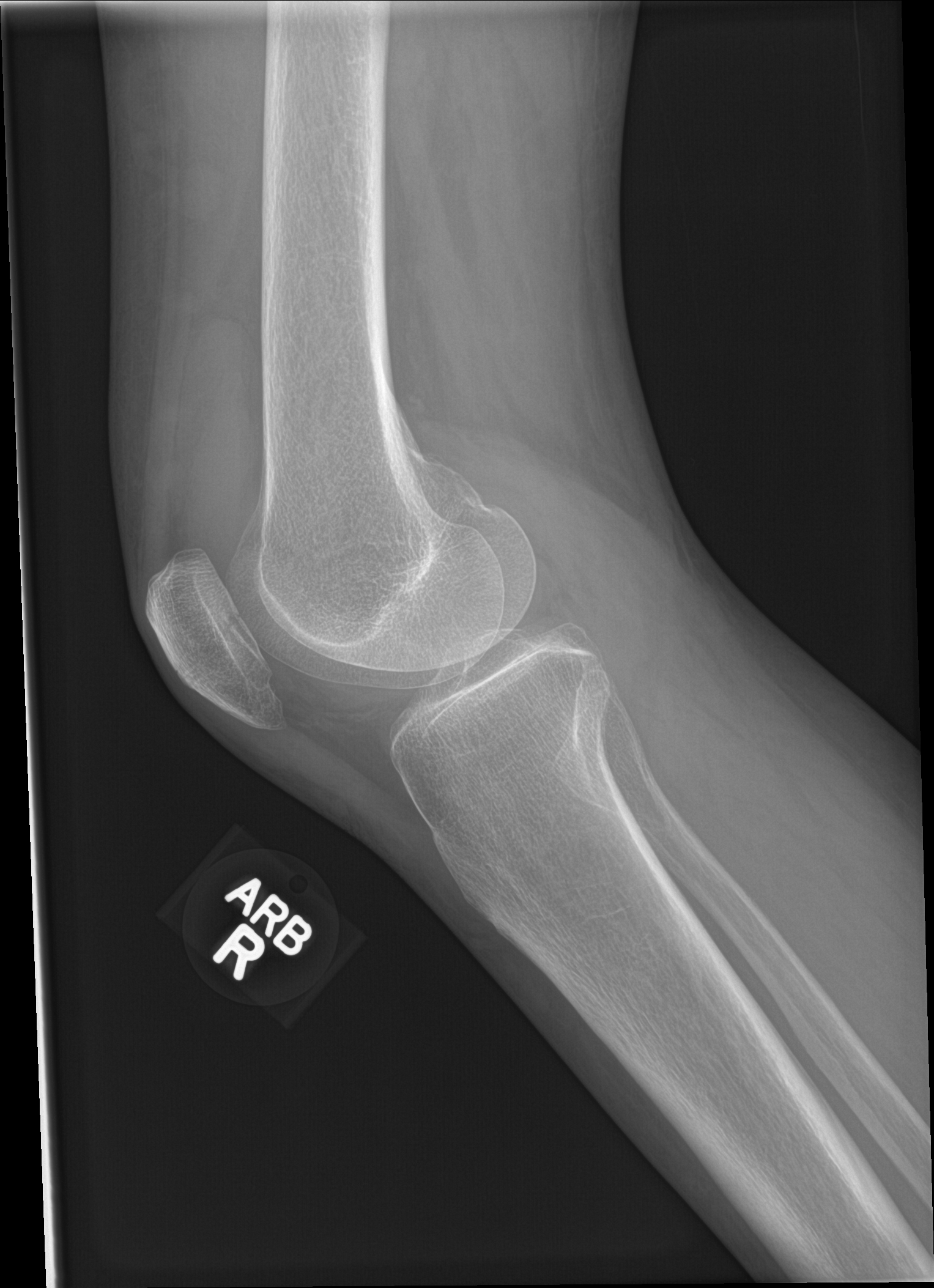

[4 of 4 positions shown; findings below may reference images not displayed]

FINDINGS: Decreased bony mineralization. The knee is located. There is an
acute, transversely oriented fracture involving the patella. No
displacement of fracture fragments. There is a moderate-to-large
joint effusion. Soft tissue swelling is seen medial to the patella.

Joint spaces of the knee are maintained. There is mild
chondrocalcinosis in the menisci.
IMPRESSION: Acute nondisplaced transversely oriented fracture of the patella
with moderate to large suprapatellar joint effusion and soft tissue
swelling.

Decreased bony mineralization.

## 2016-08-15 IMAGING — CR DG KNEE COMPLETE 4+V*L*
1 series · 4 of 4 positions shown · non-contrast
Comparison: None.

CLINICAL DATA: Right knee pain secondary to a fall today.

EXAM:
LEFT KNEE - COMPLETE 4+ VIEW

[Series 1: dxr knee lt comp with obliques · 0.14mm/px · 4 of 4 slices shown]
[im 1/4]
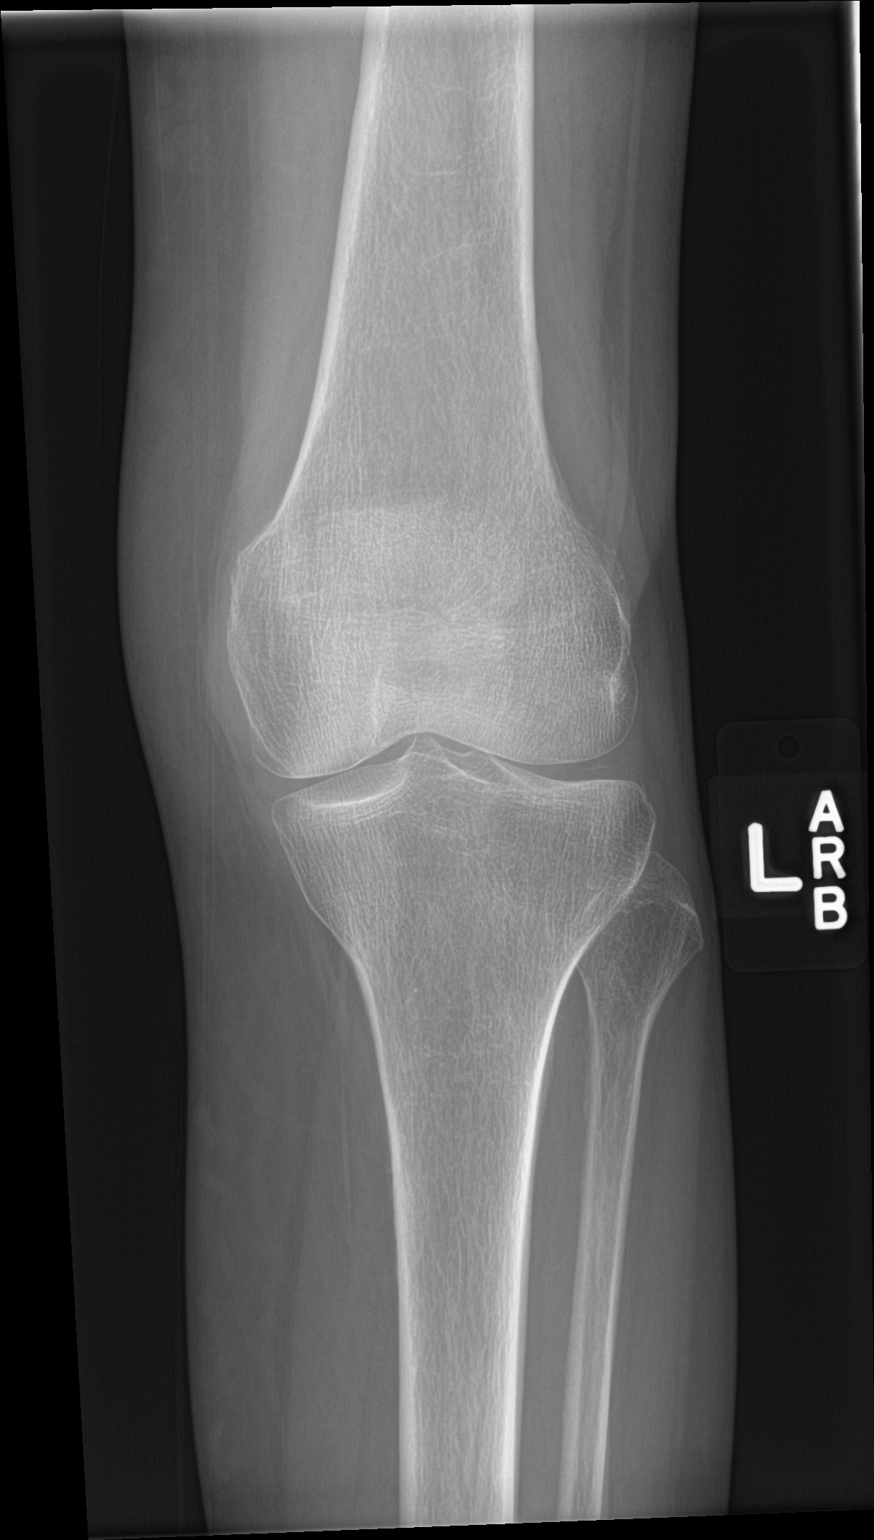
[im 2/4]
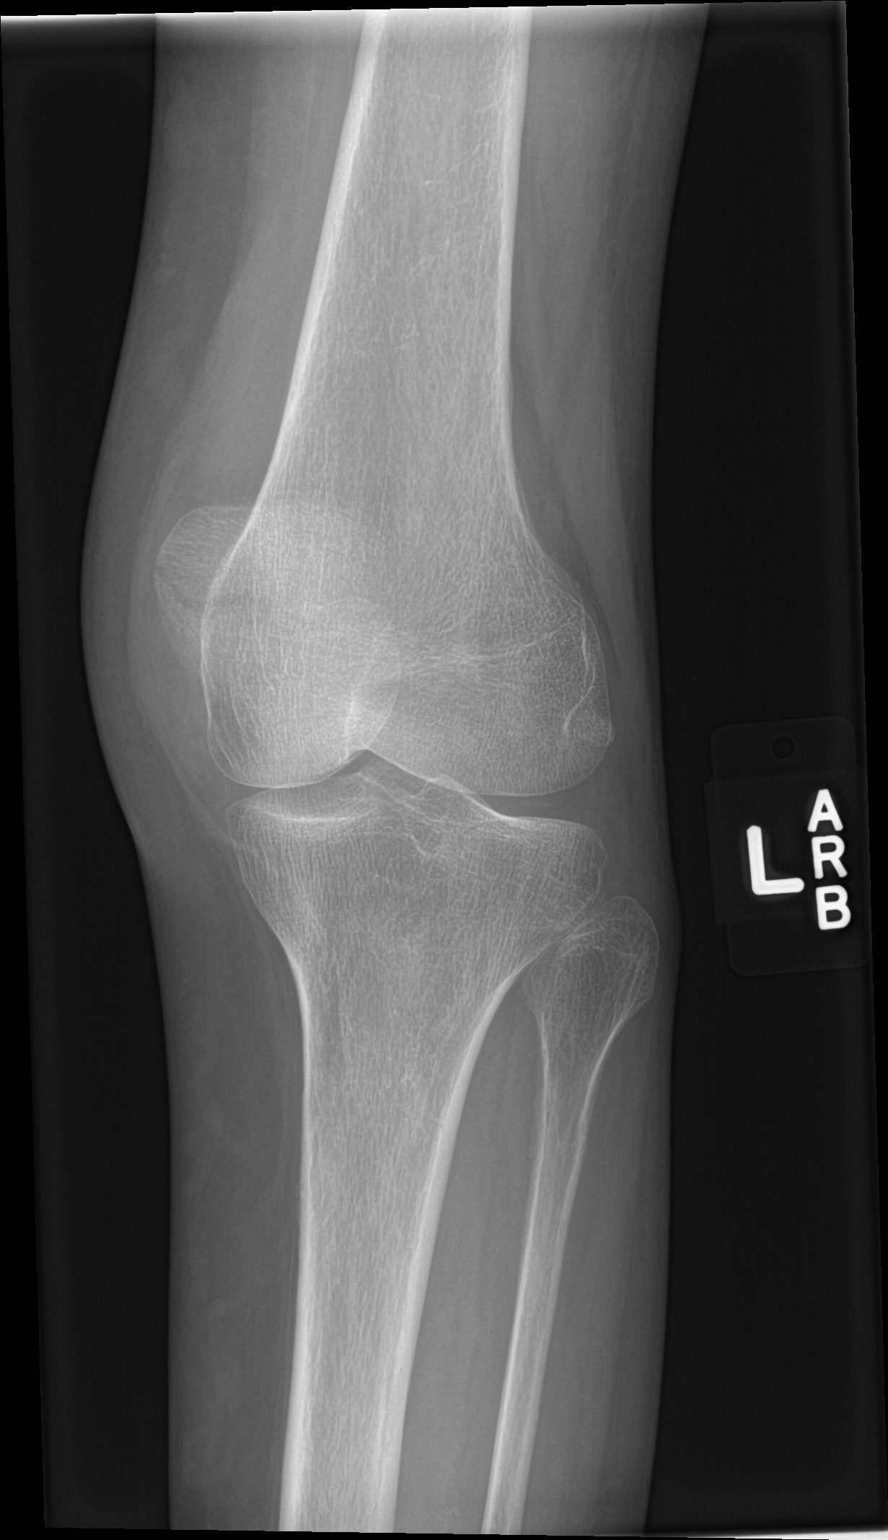
[im 3/4]
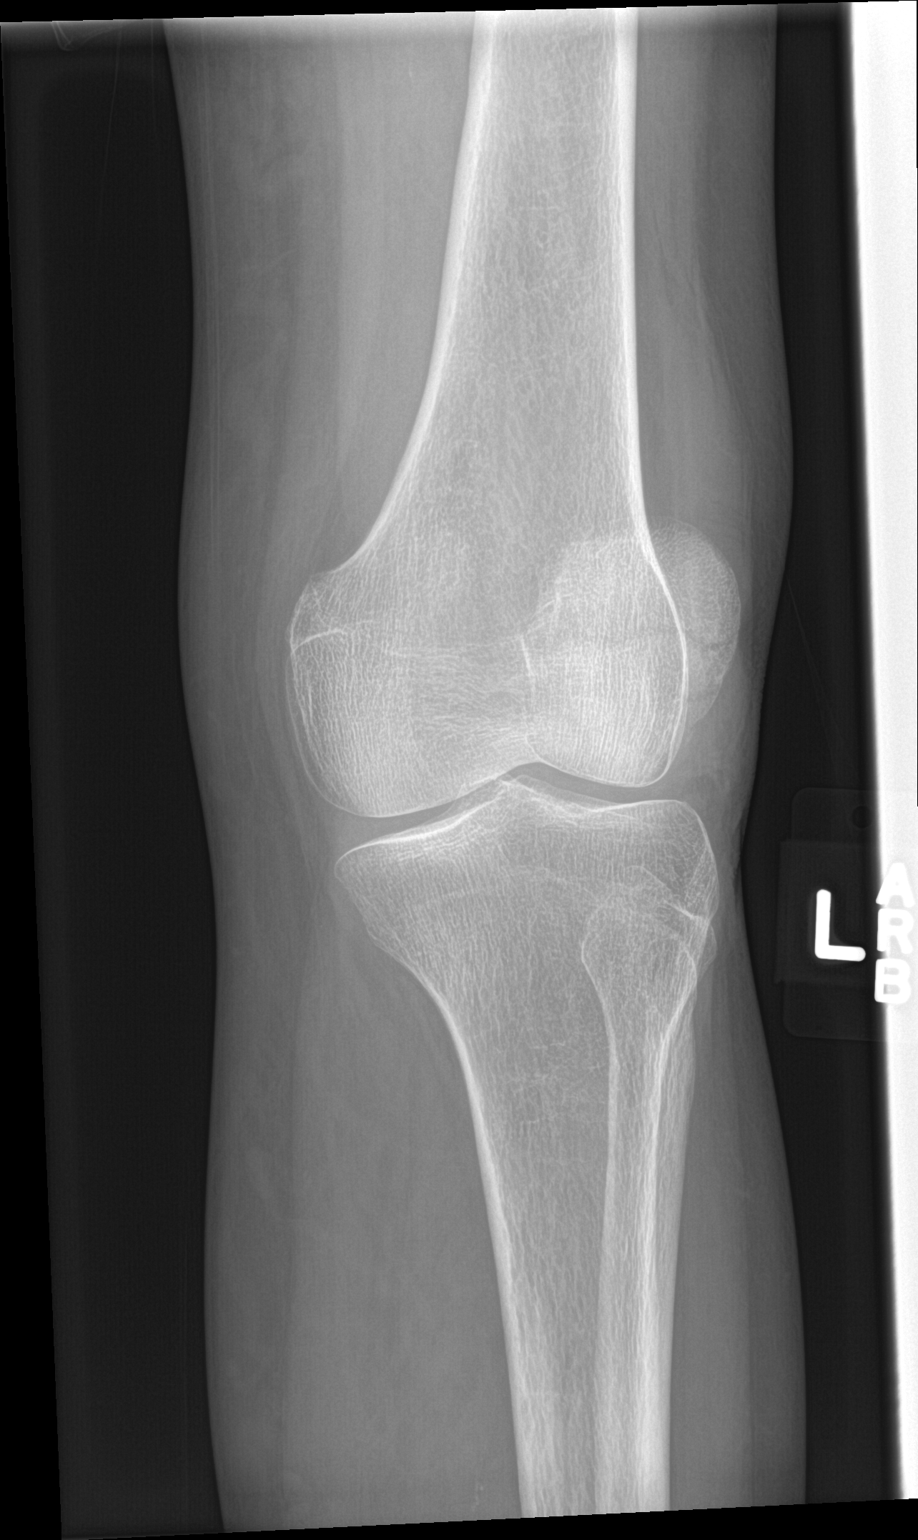
[im 4/4]
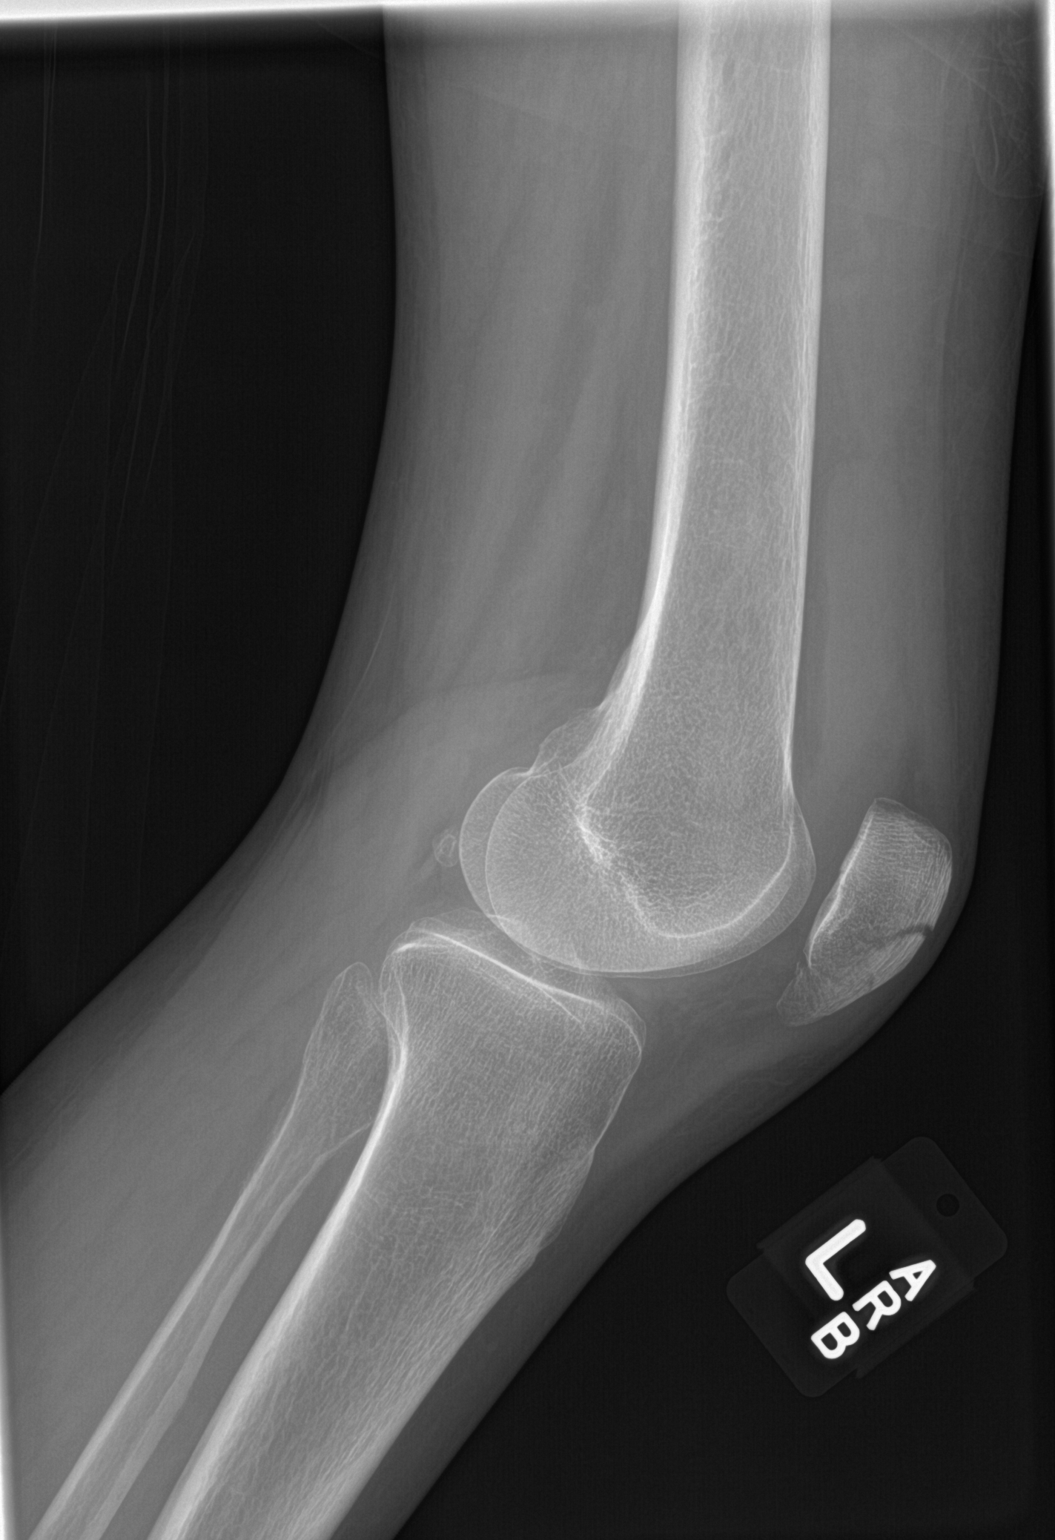

[4 of 4 positions shown; findings below may reference images not displayed]

FINDINGS: There is a comminuted fracture of the patella with minimal
distraction. There is a joint effusion. The other bones are normal.
IMPRESSION: Comminuted fracture of the patella.

## 2016-08-16 ENCOUNTER — Encounter: Payer: Self-pay | Admitting: Internal Medicine

## 2016-08-16 ENCOUNTER — Ambulatory Visit (INDEPENDENT_AMBULATORY_CARE_PROVIDER_SITE_OTHER): Payer: Medicare Other | Admitting: Internal Medicine

## 2016-08-16 VITALS — BP 130/70 | HR 82 | Temp 97.6°F | Ht 60.5 in | Wt 78.5 lb

## 2016-08-16 DIAGNOSIS — F0390 Unspecified dementia without behavioral disturbance: Secondary | ICD-10-CM | POA: Diagnosis not present

## 2016-08-16 DIAGNOSIS — Z7189 Other specified counseling: Secondary | ICD-10-CM | POA: Insufficient documentation

## 2016-08-16 DIAGNOSIS — J383 Other diseases of vocal cords: Secondary | ICD-10-CM

## 2016-08-16 DIAGNOSIS — Z0000 Encounter for general adult medical examination without abnormal findings: Secondary | ICD-10-CM | POA: Diagnosis not present

## 2016-08-16 DIAGNOSIS — E441 Mild protein-calorie malnutrition: Secondary | ICD-10-CM

## 2016-08-16 DIAGNOSIS — Z23 Encounter for immunization: Secondary | ICD-10-CM

## 2016-08-16 NOTE — Assessment & Plan Note (Signed)
See social history Blank forms given 

## 2016-08-16 NOTE — Progress Notes (Signed)
Subjective:    Patient ID: Kathryn Hodges, female    DOB: [birth year], 82 y.o.   MRN: [id number]  HPI Here with husband for initial Medicare wellness visit and follow up of chronic medical conditions Reviewed form and advanced directives Reviewed other doctors No alcohol or tobacco Ongoing memory problems Vision and hearing are okay No falls No depression or anhedonia  Has urologist due to past infections Better with care from Dr Jacqlyn Larsen No recent problems  Saw cardiologist in past No ongoing care--not sure what the issue was before  Energy levels are okay Doesn't complain of the fatigue now Still does the instrumental ADLs--though they get food out Weight is up a couple of pounds--really trying to keep up with eating Doesn't drive  No change in voice Same hoarseness No dysphagia  Current Outpatient Prescriptions on File Prior to Visit  Medication Sig Dispense Refill  . Multiple Vitamins-Minerals (ONE-A-DAY WOMENS 50+ ADVANTAGE) TABS Take by mouth daily.     No current facility-administered medications on file prior to visit.     Allergies  Allergen Reactions  . Sulfa Antibiotics Hives, Itching and Rash    Past Medical History:  Diagnosis Date  . Cancer ([place])    squamous cell skin ca  . Dementia   . Osteoporosis, senile   . Patella fracture    Left 2014, bilateral 9/15  . Spastic dysphonia     Past Surgical History:  Procedure Laterality Date  . ROTATOR CUFF REPAIR Left   . SKIN CANCER EXCISION     SCC on scalp  . VAGINAL HYSTERECTOMY      Family History  Problem Relation Age of Onset  . Dementia Brother     Social History   Social History  . Marital status: Married    Spouse name: N/A  . Number of children: 4  . Years of education: N/A   Occupational History  . Electronics engineer     Retired   Social History Main Topics  . Smoking status: Never Smoker  . Smokeless tobacco: Never Used  . Alcohol use No  . Drug use: No   . Sexual activity: No   Other Topics Concern  . Not on file   Social History Narrative   No living will   Requests husband as health care POA   Would accept resuscitation attempts   Not sure about tube feeds   Review of Systems Sleeps okay Wears seat belt Keeps up with dentist--teeth okay No chest pain No SOB No dizziness or syncope No edema Bowels are fine--no blood in stool Voids okay now No skin problems    Objective:   Physical Exam  Constitutional: She appears well-developed and well-nourished. No distress.  HENT:  Mouth/Throat: Oropharynx is clear and moist. No oropharyngeal exudate.  Neck: Normal range of motion. Neck supple. No thyromegaly present.  Cardiovascular: Normal rate, regular rhythm, normal heart sounds and intact distal pulses.  Exam reveals no gallop.   No murmur heard. Pulmonary/Chest: Effort normal and breath sounds normal. No respiratory distress. She has no wheezes. She has no rales.  Abdominal: There is no tenderness.  Musculoskeletal: She exhibits no edema or tenderness.  Lymphadenopathy:    She has no cervical adenopathy.  Neurological: She is alert.  "August, 1984" ??Architect-- ?? Recall 0/3  Skin: No rash noted. No erythema.  Psychiatric: She has a normal mood and affect. Her behavior is normal.          Assessment &  Plan:

## 2016-08-16 NOTE — Assessment & Plan Note (Signed)
Voice is stable

## 2016-08-16 NOTE — Assessment & Plan Note (Signed)
I have personally reviewed the Medicare Annual Wellness questionnaire and have noted 1. The patient's medical and social history 2. Their use of alcohol, tobacco or illicit drugs 3. Their current medications and supplements 4. The patient's functional ability including ADL's, fall risks, home safety risks and hearing or visual             impairment. 5. Diet and physical activities 6. Evidence for depression or mood disorders  The patients weight, height, BMI and visual acuity have been recorded in the chart I have made referrals, counseling and provided education to the patient based review of the above and I have provided the pt with a written personalized care plan for preventive services.  I have provided you with a copy of your personalized plan for preventive services. Please take the time to review along with your updated medication list.  No cancer screening due to age prevnar and flu vaccines today

## 2016-08-16 NOTE — Addendum Note (Signed)
Addended by: Pilar Grammes on: 08/16/2016 06:04 PM   Modules accepted: Orders

## 2016-08-16 NOTE — Assessment & Plan Note (Signed)
Probably vascular No major functional changes in the past year No Rx Husband will let me know of any changes of significance

## 2016-08-16 NOTE — Progress Notes (Signed)
Pre visit review using our clinic review tool, if applicable. No additional management support is needed unless otherwise documented below in the visit note. 

## 2016-08-16 NOTE — Assessment & Plan Note (Signed)
Weight stable Working on trying to eat regularly Better with them eating out

## 2016-08-27 DIAGNOSIS — S51812A Laceration without foreign body of left forearm, initial encounter: Secondary | ICD-10-CM | POA: Diagnosis not present

## 2016-08-31 DIAGNOSIS — L03114 Cellulitis of left upper limb: Secondary | ICD-10-CM | POA: Diagnosis not present

## 2016-08-31 DIAGNOSIS — S41112A Laceration without foreign body of left upper arm, initial encounter: Secondary | ICD-10-CM | POA: Diagnosis not present

## 2016-11-11 ENCOUNTER — Ambulatory Visit: Payer: Medicare Other | Admitting: Podiatry

## 2016-11-23 ENCOUNTER — Ambulatory Visit: Payer: Medicare Other | Admitting: Podiatry

## 2016-11-25 ENCOUNTER — Encounter: Payer: Self-pay | Admitting: Podiatry

## 2016-11-25 ENCOUNTER — Ambulatory Visit (INDEPENDENT_AMBULATORY_CARE_PROVIDER_SITE_OTHER): Payer: Medicare Other | Admitting: Podiatry

## 2016-11-25 DIAGNOSIS — B351 Tinea unguium: Secondary | ICD-10-CM | POA: Diagnosis not present

## 2016-11-25 DIAGNOSIS — M79675 Pain in left toe(s): Secondary | ICD-10-CM | POA: Diagnosis not present

## 2016-11-25 DIAGNOSIS — M79674 Pain in right toe(s): Secondary | ICD-10-CM | POA: Diagnosis not present

## 2016-11-25 NOTE — Progress Notes (Signed)
Subjective: 82 y.o. returns the office today for painful, elongated, thickened toenails which she cannot trim herself. Denies any redness or drainage around the nails. Denies any acute changes since last appointment and no new complaints today. Denies any systemic complaints such as fevers, chills, nausea, vomiting.   Objective: NAD DP/PT pulses palpable, CRT less than 3 seconds Nails hypertrophic, dystrophic, elongated, brittle, discolored 10. There is tenderness overlying the nails 1-5 bilaterally. There is no surrounding erythema or drainage along the nail sites. No open lesions or pre-ulcerative lesions are identified. No other areas of tenderness bilateral lower extremities. No overlying edema, erythema, increased warmth. No pain with calf compression, swelling, warmth, erythema.  Assessment: Patient presents with symptomatic onychomycosis  Plan: -Treatment options including alternatives, risks, complications were discussed -Nails sharply debrided 10 without complication/bleeding. -Discussed daily foot inspection. If there are any changes, to call the office immediately.  -Follow-up in 3 months or sooner if any problems are to arise. In the meantime, encouraged to call the office with any questions, concerns, changes symptoms.  Celesta Gentile, DPM

## 2017-03-11 ENCOUNTER — Telehealth: Payer: Self-pay | Admitting: Internal Medicine

## 2017-03-11 ENCOUNTER — Ambulatory Visit: Payer: Medicare Other | Admitting: Internal Medicine

## 2017-03-11 ENCOUNTER — Telehealth: Payer: Self-pay

## 2017-03-11 DIAGNOSIS — A499 Bacterial infection, unspecified: Secondary | ICD-10-CM | POA: Diagnosis not present

## 2017-03-11 DIAGNOSIS — N390 Urinary tract infection, site not specified: Secondary | ICD-10-CM | POA: Diagnosis not present

## 2017-03-11 DIAGNOSIS — R350 Frequency of micturition: Secondary | ICD-10-CM | POA: Diagnosis not present

## 2017-03-11 NOTE — Telephone Encounter (Signed)
I called and left a message asking that Kathryn Hodges or Clay call and let us know how she was doing. Dr Silvio Pate and I had waited for her until about 145 today for her 1pm appt.

## 2017-03-11 NOTE — Telephone Encounter (Signed)
Doren Custard called back and said he took her to a walk in clinic and she was diagnosed with a UTI and gave her an antibiotic.

## 2017-03-11 NOTE — Telephone Encounter (Signed)
Pt did not show up for their acute visit.  Do you want to charge No Show Fee? °

## 2017-03-12 NOTE — Telephone Encounter (Signed)
No Please check with them that everything is okay

## 2017-03-14 ENCOUNTER — Ambulatory Visit: Payer: Medicare Other | Admitting: Podiatry

## 2017-03-16 ENCOUNTER — Ambulatory Visit (INDEPENDENT_AMBULATORY_CARE_PROVIDER_SITE_OTHER): Payer: Medicare Other | Admitting: Family Medicine

## 2017-03-16 ENCOUNTER — Encounter: Payer: Self-pay | Admitting: Family Medicine

## 2017-03-16 VITALS — BP 116/60 | HR 94 | Temp 98.2°F | Wt 77.2 lb

## 2017-03-16 DIAGNOSIS — R7309 Other abnormal glucose: Secondary | ICD-10-CM | POA: Diagnosis not present

## 2017-03-16 DIAGNOSIS — R7989 Other specified abnormal findings of blood chemistry: Secondary | ICD-10-CM

## 2017-03-16 DIAGNOSIS — N3090 Cystitis, unspecified without hematuria: Secondary | ICD-10-CM

## 2017-03-16 DIAGNOSIS — R5383 Other fatigue: Secondary | ICD-10-CM

## 2017-03-16 DIAGNOSIS — R945 Abnormal results of liver function studies: Secondary | ICD-10-CM

## 2017-03-16 LAB — CBC WITH DIFFERENTIAL/PLATELET
BASOS PCT: 0.5 % (ref 0.0–3.0)
Basophils Absolute: 0 10*3/uL (ref 0.0–0.1)
Eosinophils Absolute: 0.1 10*3/uL (ref 0.0–0.7)
Eosinophils Relative: 2.5 % (ref 0.0–5.0)
HEMATOCRIT: 37.9 % (ref 36.0–46.0)
HEMOGLOBIN: 12.4 g/dL (ref 12.0–15.0)
LYMPHS PCT: 10.5 % — AB (ref 12.0–46.0)
Lymphs Abs: 0.5 10*3/uL — ABNORMAL LOW (ref 0.7–4.0)
MCHC: 32.7 g/dL (ref 30.0–36.0)
MCV: 99.2 fl (ref 78.0–100.0)
MONOS PCT: 11.5 % (ref 3.0–12.0)
Monocytes Absolute: 0.5 10*3/uL (ref 0.1–1.0)
NEUTROS ABS: 3.3 10*3/uL (ref 1.4–7.7)
Neutrophils Relative %: 75 % (ref 43.0–77.0)
PLATELETS: 220 10*3/uL (ref 150.0–400.0)
RBC: 3.82 Mil/uL — ABNORMAL LOW (ref 3.87–5.11)
RDW: 14 % (ref 11.5–15.5)
WBC: 4.4 10*3/uL (ref 4.0–10.5)

## 2017-03-16 LAB — COMPREHENSIVE METABOLIC PANEL
ALBUMIN: 4.1 g/dL (ref 3.5–5.2)
ALK PHOS: 366 U/L — AB (ref 39–117)
ALT: 172 U/L — AB (ref 0–35)
AST: 104 U/L — AB (ref 0–37)
BILIRUBIN TOTAL: 0.6 mg/dL (ref 0.2–1.2)
BUN: 24 mg/dL — ABNORMAL HIGH (ref 6–23)
CALCIUM: 9.8 mg/dL (ref 8.4–10.5)
CO2: 31 meq/L (ref 19–32)
CREATININE: 0.79 mg/dL (ref 0.40–1.20)
Chloride: 104 mEq/L (ref 96–112)
GFR: 73.79 mL/min (ref >60.00)
GLUCOSE: 178 mg/dL — AB (ref 70–99)
Potassium: 4.7 mEq/L (ref 3.5–5.1)
Sodium: 141 mEq/L (ref 135–145)
Total Protein: 7.7 g/dL (ref 6.0–8.3)

## 2017-03-16 NOTE — Progress Notes (Signed)
   Subjective:    Patient ID: Kathryn Hodges, female    DOB: 03/30/1931, 83 y.o.   MRN: [id number]  HPI This is an 83 yo female, accompanied by her husband, who presents today with fatigue x 2 weeks. Was diagnosed with UTI 6 days ago and was seen at [facility]. Was initially prescribed macrobid but was called a couple of days ago and told bacteria not susceptible to macrobid and was switched to keflex. No fevers. No nausea or vomiting. Has been fatigued prior to infection symptoms starting. Normal appetite.   Past Medical History:  Diagnosis Date  . Cancer ([place])    squamous cell skin ca  . Dementia   . Osteoporosis, senile   . Patella fracture    Left 2014, bilateral 9/15  . Spastic dysphonia    Past Surgical History:  Procedure Laterality Date  . ROTATOR CUFF REPAIR Left   . SKIN CANCER EXCISION     SCC on scalp  . VAGINAL HYSTERECTOMY     Family History  Problem Relation Age of Onset  . Dementia Brother       Review of Systems Per HPI    Objective:   Physical Exam  Constitutional:  Elderly, thin and frail and in no acute distress.   HENT:  Head: Normocephalic and atraumatic.  Eyes: Conjunctivae are normal.  Neck: Normal range of motion. Neck supple.  Cardiovascular: Normal rate, regular rhythm and normal heart sounds.   Pulmonary/Chest: Effort normal and breath sounds normal.  Abdominal: Soft. She exhibits no distension. There is no tenderness. There is no rebound and no guarding.  Musculoskeletal: She exhibits no edema.  Lymphadenopathy:    She has no cervical adenopathy.  Neurological: She is alert.  Skin: Skin is warm and dry.  Psychiatric: She has a normal mood and affect. Her behavior is normal.  Vitals reviewed.     BP 116/60 (BP Location: Left Arm, Patient Position: Sitting, Cuff Size: Normal)   Pulse 94   Temp 98.2 F (36.8 C) (Oral)   Wt 77 lb 4 oz (35 kg)   SpO2 96%   BMI 14.84 kg/m  Wt Readings from Last 3 Encounters:  03/16/17 77  lb 4 oz (35 kg)  08/16/16 78 lb 8 oz (35.6 kg)  07/02/16 76 lb 12 oz (34.8 kg)       Assessment & Plan:  1. Cystitis - no worrisome symptoms or physical exam findings to suggest pyelo. Finish cephalexin.  - CBC with Differential/Platelet - Comprehensive metabolic panel - Urinalysis with Culture, if indicated; Future - recheck urine in 3 weeks - suggested she add 8 oz cranberry juice daily and reviewed RTC/ER precautions  2. Other fatigue - certainly could be from UTI; she has just started appropriate antibiotic in last 24 hours, discussed with patient and her husband and expectations for gradual improvement. - CBC with Differential/Platelet - Comprehensive metabolic panel   Clarene Reamer, FNP-BC   [facility]  03/16/2017 3:57 PM

## 2017-03-16 NOTE — Progress Notes (Signed)
Pre visit review using our clinic review tool, if applicable. No additional management support is needed unless otherwise documented below in the visit note. 

## 2017-03-16 NOTE — Patient Instructions (Signed)
You will be notified tomorrow of the blood work results  Try to add 8 ounces of cranberry juice a day  If any worsening symptoms- fever/chills, nausea/vomiting, pain, please let us know  Repeat urine test in 3 weeks.

## 2017-03-18 NOTE — Addendum Note (Signed)
Addended by: Clarene Reamer B on: 03/18/2017 09:12 AM   Modules accepted: Orders

## 2017-03-21 ENCOUNTER — Other Ambulatory Visit (INDEPENDENT_AMBULATORY_CARE_PROVIDER_SITE_OTHER): Payer: Medicare Other

## 2017-03-21 DIAGNOSIS — R945 Abnormal results of liver function studies: Principal | ICD-10-CM

## 2017-03-21 DIAGNOSIS — R7989 Other specified abnormal findings of blood chemistry: Secondary | ICD-10-CM | POA: Diagnosis not present

## 2017-03-21 DIAGNOSIS — R7309 Other abnormal glucose: Secondary | ICD-10-CM | POA: Diagnosis not present

## 2017-03-21 LAB — COMPREHENSIVE METABOLIC PANEL
ALK PHOS: 208 U/L — AB (ref 39–117)
ALT: 93 U/L — AB (ref 0–35)
AST: 52 U/L — ABNORMAL HIGH (ref 0–37)
Albumin: 4.2 g/dL (ref 3.5–5.2)
BILIRUBIN TOTAL: 0.4 mg/dL (ref 0.2–1.2)
BUN: 21 mg/dL (ref 6–23)
CO2: 29 meq/L (ref 19–32)
Calcium: 10 mg/dL (ref 8.4–10.5)
Chloride: 104 mEq/L (ref 96–112)
Creatinine, Ser: 0.67 mg/dL (ref 0.40–1.20)
GFR: 89.24 mL/min (ref >60.00)
GLUCOSE: 102 mg/dL — AB (ref 70–99)
Potassium: 3.6 mEq/L (ref 3.5–5.1)
SODIUM: 140 meq/L (ref 135–145)
TOTAL PROTEIN: 7.2 g/dL (ref 6.0–8.3)

## 2017-03-30 DIAGNOSIS — N3020 Other chronic cystitis without hematuria: Secondary | ICD-10-CM | POA: Diagnosis not present

## 2017-03-30 DIAGNOSIS — N200 Calculus of kidney: Secondary | ICD-10-CM | POA: Diagnosis not present

## 2017-03-30 DIAGNOSIS — Z87442 Personal history of urinary calculi: Secondary | ICD-10-CM | POA: Diagnosis not present

## 2017-03-30 DIAGNOSIS — Q632 Ectopic kidney: Secondary | ICD-10-CM | POA: Diagnosis not present

## 2017-03-30 DIAGNOSIS — R339 Retention of urine, unspecified: Secondary | ICD-10-CM | POA: Diagnosis not present

## 2017-04-05 ENCOUNTER — Ambulatory Visit (INDEPENDENT_AMBULATORY_CARE_PROVIDER_SITE_OTHER): Payer: Medicare Other | Admitting: Podiatry

## 2017-04-05 DIAGNOSIS — M79676 Pain in unspecified toe(s): Secondary | ICD-10-CM | POA: Diagnosis not present

## 2017-04-05 DIAGNOSIS — B351 Tinea unguium: Secondary | ICD-10-CM | POA: Diagnosis not present

## 2017-04-06 ENCOUNTER — Other Ambulatory Visit: Payer: Medicare Other

## 2017-04-06 NOTE — Progress Notes (Signed)
   SUBJECTIVE Patient  presents to office today complaining of elongated, thickened nails. Pain while ambulating in shoes. Patient is unable to trim their own nails.   OBJECTIVE General Patient is awake, alert, and oriented x 3 and in no acute distress. Derm Skin is dry and supple bilateral. Negative open lesions or macerations. Remaining integument unremarkable. Nails are tender, long, thickened and dystrophic with subungual debris, consistent with onychomycosis, 1-5 bilateral. No signs of infection noted. Vasc  DP and PT pedal pulses palpable bilaterally. Temperature gradient within normal limits.  Neuro Epicritic and protective threshold sensation diminished bilaterally.  Musculoskeletal Exam No symptomatic pedal deformities noted bilateral. Muscular strength within normal limits.  ASSESSMENT 1. Onychodystrophic nails 1-5 bilateral with hyperkeratosis of nails.  2. Onychomycosis of nail due to dermatophyte bilateral 3. Pain in foot bilateral  PLAN OF CARE 1. Patient evaluated today.  2. Instructed to maintain good pedal hygiene and foot care.  3. Mechanical debridement of nails 1-5 bilaterally performed using a nail nipper. Filed with dremel without incident.  4. Return to clinic in 3 mos.    Brent M. Evans, DPM [facility]  Dr. Brent M. Evans, DPM    [address]                                        [place], [place] [address]                Office [phone]  Fax [phone]      

## 2017-04-12 ENCOUNTER — Ambulatory Visit: Payer: Medicare Other | Admitting: Podiatry

## 2017-04-27 DIAGNOSIS — N23 Unspecified renal colic: Secondary | ICD-10-CM | POA: Diagnosis not present

## 2017-04-27 DIAGNOSIS — N3020 Other chronic cystitis without hematuria: Secondary | ICD-10-CM | POA: Diagnosis not present

## 2017-04-27 DIAGNOSIS — N200 Calculus of kidney: Secondary | ICD-10-CM | POA: Diagnosis not present

## 2017-04-29 DIAGNOSIS — N200 Calculus of kidney: Secondary | ICD-10-CM | POA: Diagnosis not present

## 2017-04-29 DIAGNOSIS — Z90710 Acquired absence of both cervix and uterus: Secondary | ICD-10-CM | POA: Diagnosis not present

## 2017-04-29 DIAGNOSIS — N289 Disorder of kidney and ureter, unspecified: Secondary | ICD-10-CM | POA: Diagnosis not present

## 2017-05-24 DIAGNOSIS — N3020 Other chronic cystitis without hematuria: Secondary | ICD-10-CM | POA: Diagnosis not present

## 2017-05-24 DIAGNOSIS — N200 Calculus of kidney: Secondary | ICD-10-CM | POA: Diagnosis not present

## 2017-05-24 DIAGNOSIS — R339 Retention of urine, unspecified: Secondary | ICD-10-CM | POA: Diagnosis not present

## 2017-06-03 ENCOUNTER — Telehealth: Payer: Self-pay | Admitting: Internal Medicine

## 2017-06-03 NOTE — Telephone Encounter (Signed)
Okay to give appt for Monday

## 2017-06-03 NOTE — Telephone Encounter (Signed)
Patient has been having periods of low energy every other day.  Patient's husband thinks it could be related to the UTI she has had for the past 3 months.  I let patient's husband know the first available appointment with Dr.Letvak would be next Friday, but there are same day appointments on Monday. I let patient's husband know he could call back on Monday to schedule appointment.  Patient's husband was upset about the same day rule and doesn't understand why he can't make the appointment. Please advise.

## 2017-06-03 NOTE — Telephone Encounter (Signed)
I spoke to patient's husband and he scheduled appointment for patient on Monday.

## 2017-06-06 ENCOUNTER — Ambulatory Visit (INDEPENDENT_AMBULATORY_CARE_PROVIDER_SITE_OTHER): Payer: Medicare Other | Admitting: Internal Medicine

## 2017-06-06 ENCOUNTER — Encounter: Payer: Self-pay | Admitting: Internal Medicine

## 2017-06-06 VITALS — BP 140/74 | HR 94 | Temp 97.8°F | Wt 75.0 lb

## 2017-06-06 DIAGNOSIS — E441 Mild protein-calorie malnutrition: Secondary | ICD-10-CM | POA: Diagnosis not present

## 2017-06-06 DIAGNOSIS — F0390 Unspecified dementia without behavioral disturbance: Secondary | ICD-10-CM

## 2017-06-06 DIAGNOSIS — N3020 Other chronic cystitis without hematuria: Secondary | ICD-10-CM | POA: Diagnosis not present

## 2017-06-06 DIAGNOSIS — R5383 Other fatigue: Secondary | ICD-10-CM

## 2017-06-06 LAB — COMPREHENSIVE METABOLIC PANEL
ALK PHOS: 50 U/L (ref 39–117)
ALT: 15 U/L (ref 0–35)
AST: 23 U/L (ref 0–37)
Albumin: 4.7 g/dL (ref 3.5–5.2)
BILIRUBIN TOTAL: 0.7 mg/dL (ref 0.2–1.2)
BUN: 22 mg/dL (ref 6–23)
CALCIUM: 10.6 mg/dL — AB (ref 8.4–10.5)
CO2: 30 meq/L (ref 19–32)
CREATININE: 0.68 mg/dL (ref 0.40–1.20)
Chloride: 101 mEq/L (ref 96–112)
GFR: 87.68 mL/min (ref >60.00)
GLUCOSE: 82 mg/dL (ref 70–99)
Potassium: 4.7 mEq/L (ref 3.5–5.1)
Sodium: 139 mEq/L (ref 135–145)
TOTAL PROTEIN: 7.6 g/dL (ref 6.0–8.3)

## 2017-06-06 LAB — T4, FREE: Free T4: 1.11 ng/dL (ref 0.60–1.60)

## 2017-06-06 NOTE — Assessment & Plan Note (Signed)
Ongoing chronic problem No PE findings New med trospium---not clearly implicated (but may want to try off) Will recheck labs--given abnormal LFTs in April (but had benign CT scan)

## 2017-06-06 NOTE — Assessment & Plan Note (Signed)
Mild and stable

## 2017-06-06 NOTE — Progress Notes (Signed)
   Subjective:    Patient ID: Kathryn Hodges, female    DOB: 1931-08-24, 83 y.o.   MRN: [id number]  HPI Here with husband due to ongoing fatigue UTIs a problem since APril especially Continues to see Dr Jacqlyn Larsen at times--reviewed recent CT done in [facility]  Continues to be weak intermittently Husband thinks this is every 3 days or so Still does instrumental ADLs--but has to lie down and rest at times Most afternoons she has to rest though--but usually only 30 minutes and feels better  No SOB No chest pain No N/V Bowels are okay No dysuria or hematuria now  Mild memory decline over the past year No functional changes of note  Current Outpatient Prescriptions on File Prior to Visit  Medication Sig Dispense Refill  . Multiple Vitamins-Minerals (ONE-A-DAY WOMENS 50+ ADVANTAGE) TABS Take by mouth daily.     No current facility-administered medications on file prior to visit.     Allergies  Allergen Reactions  . Sulfa Antibiotics Hives, Itching, Rash and Other (See Comments)    Other Reaction: Other reaction    Past Medical History:  Diagnosis Date  . Cancer ([place])    squamous cell skin ca  . Dementia   . Osteoporosis, senile   . Patella fracture    Left 2014, bilateral 9/15  . Spastic dysphonia     Past Surgical History:  Procedure Laterality Date  . ROTATOR CUFF REPAIR Left   . SKIN CANCER EXCISION     SCC on scalp  . VAGINAL HYSTERECTOMY      Family History  Problem Relation Age of Onset  . Dementia Brother     Social History   Social History  . Marital status: Married    Spouse name: N/A  . Number of children: 4  . Years of education: N/A   Occupational History  . Electronics engineer     Retired   Social History Main Topics  . Smoking status: Never Smoker  . Smokeless tobacco: Never Used  . Alcohol use No  . Drug use: No  . Sexual activity: No   Other Topics Concern  . Not on file   Social History Narrative   No living  will   Requests husband as health care POA   Would accept resuscitation attempts   Not sure about tube feeds   Review of Systems Appetite is about the same Back down a couple of pounds Sleeps fairly well    Objective:   Physical Exam  Constitutional: No distress.  Same muscle wasted appearance  Neck: No thyromegaly present.  Cardiovascular: Normal rate, regular rhythm and normal heart sounds.  Exam reveals no gallop.   No murmur heard. Pulmonary/Chest: Effort normal and breath sounds normal. No respiratory distress. She has no wheezes. She has no rales.  Abdominal: Soft. She exhibits no distension. There is no tenderness. There is no rebound and no guarding.  Musculoskeletal: She exhibits no edema or tenderness.  Lymphadenopathy:    She has no cervical adenopathy.  Skin: No rash noted. No erythema.          Assessment & Plan:

## 2017-06-06 NOTE — Assessment & Plan Note (Signed)
Weight basically unchanged for some time--but not up Won't take the boost, etc

## 2017-06-06 NOTE — Assessment & Plan Note (Signed)
Unclear if sanctura doing anything Probably should try off again

## 2017-06-06 NOTE — Patient Instructions (Signed)
Please try to get some of your meals delivered from the [facility]. There are also prepared meals at the supermarkets. You can try off the trospium (sanctura) to see if that makes any difference

## 2017-06-07 ENCOUNTER — Encounter: Payer: Self-pay | Admitting: *Deleted

## 2017-06-07 LAB — CBC WITH DIFFERENTIAL/PLATELET
BASOS ABS: 0 10*3/uL (ref 0.0–0.1)
Basophils Relative: 1 % (ref 0.0–3.0)
EOS ABS: 0 10*3/uL (ref 0.0–0.7)
Eosinophils Relative: 0.7 % (ref 0.0–5.0)
HCT: 33.8 % — ABNORMAL LOW (ref 36.0–46.0)
Hemoglobin: 11.7 g/dL — ABNORMAL LOW (ref 12.0–15.0)
Lymphocytes Relative: 21.4 % (ref 12.0–46.0)
Lymphs Abs: 0.8 10*3/uL (ref 0.7–4.0)
MCHC: 34.7 g/dL (ref 30.0–36.0)
MCV: 96.4 fl (ref 78.0–100.0)
MONO ABS: 0.4 10*3/uL (ref 0.1–1.0)
Monocytes Relative: 9.6 % (ref 3.0–12.0)
NEUTROS PCT: 67.3 % (ref 43.0–77.0)
Neutro Abs: 2.6 10*3/uL (ref 1.4–7.7)
Platelets: 165 10*3/uL (ref 150.0–400.0)
RBC: 3.51 Mil/uL — AB (ref 3.87–5.11)
RDW: 14 % (ref 11.5–15.5)
WBC: 3.9 10*3/uL — ABNORMAL LOW (ref 4.0–10.5)

## 2017-06-15 ENCOUNTER — Telehealth: Payer: Self-pay | Admitting: Internal Medicine

## 2017-06-15 NOTE — Telephone Encounter (Signed)
Spouse wants a copy of recent labs please advise when ready for pick up   Best number # [phone]

## 2017-06-15 NOTE — Telephone Encounter (Signed)
I already mailed them out yesterday. I had advised him of that yesterday when I spoke to him. See lab note. I will talk to him.

## 2017-06-21 ENCOUNTER — Telehealth: Payer: Self-pay

## 2017-06-21 NOTE — Telephone Encounter (Signed)
I would get the stool test/IFOB done (prior to adding on iron, will defer this to the PCP), especially since the anemia was mild.  She may have trouble with constipation or darker stools with the iron so I would get one thing done at a time first, ie get the stool test done.   Thanks.

## 2017-06-21 NOTE — Telephone Encounter (Signed)
Kathryn Hodges pts husband left v/m (do not see DPR) concerned that pt is washed out every morning for 2 - 3 months, ?dx with anemia; pt last seen 06/06/17. Phillip request med to build up iron level so pt will not have to go thru this weakness and washed out feeling and lack of energy; when pt gets up will eat breakfast and then has to sit back down. Kathryn Hodges trying to get stool specimens now. Phillip request cb. Dr Silvio Pate out of office without computer access.Please advise.

## 2017-06-21 NOTE — Telephone Encounter (Signed)
Spoke to pt's husband. He said he has gotten 2 of the 3 tests done. He said if he can't get the last one done before tomorrow afternoon, he will just bring the 2 he has. He said he understands the reason for not starting anything until the results are in. He is just concerned with her lack of energy.

## 2017-06-22 NOTE — Telephone Encounter (Signed)
Spoke to pt's husband. I advised him to try Flintstones Vitamins with added Iron. She will start with 2 a day, probably 1 in the morning and 1 at night. He will most likely bring the 2 stool test he has to the office today.

## 2017-06-22 NOTE — Telephone Encounter (Signed)
okay

## 2017-06-22 NOTE — Telephone Encounter (Signed)
I'll defer, ,thanks .

## 2017-06-22 NOTE — Telephone Encounter (Signed)
Okay to have her start the iron. I am not convinced that the anemia is severe enough to cause her symptoms---but worth trying the iron anyway (I am mostly just trying to make sure we are not missing a GI bleeding source)

## 2017-06-24 ENCOUNTER — Other Ambulatory Visit: Payer: Self-pay | Admitting: Internal Medicine

## 2017-06-24 ENCOUNTER — Other Ambulatory Visit (INDEPENDENT_AMBULATORY_CARE_PROVIDER_SITE_OTHER): Payer: Medicare Other

## 2017-06-24 DIAGNOSIS — Z1211 Encounter for screening for malignant neoplasm of colon: Secondary | ICD-10-CM

## 2017-06-24 LAB — FECAL OCCULT BLOOD, IMMUNOCHEMICAL: FECAL OCCULT BLD: NEGATIVE

## 2017-07-05 ENCOUNTER — Ambulatory Visit (INDEPENDENT_AMBULATORY_CARE_PROVIDER_SITE_OTHER): Payer: Medicare Other | Admitting: Podiatry

## 2017-07-05 DIAGNOSIS — S62509A Fracture of unspecified phalanx of unspecified thumb, initial encounter for closed fracture: Secondary | ICD-10-CM | POA: Insufficient documentation

## 2017-07-05 DIAGNOSIS — M79676 Pain in unspecified toe(s): Secondary | ICD-10-CM | POA: Diagnosis not present

## 2017-07-05 DIAGNOSIS — B351 Tinea unguium: Secondary | ICD-10-CM

## 2017-07-05 DIAGNOSIS — S82009A Unspecified fracture of unspecified patella, initial encounter for closed fracture: Secondary | ICD-10-CM | POA: Insufficient documentation

## 2017-07-05 DIAGNOSIS — M25569 Pain in unspecified knee: Secondary | ICD-10-CM | POA: Insufficient documentation

## 2017-07-09 NOTE — Progress Notes (Signed)
   SUBJECTIVE Patient  presents to office today complaining of elongated, thickened nails. Pain while ambulating in shoes. Patient is unable to trim their own nails.   OBJECTIVE General Patient is awake, alert, and oriented x 3 and in no acute distress. Derm Skin is dry and supple bilateral. Negative open lesions or macerations. Remaining integument unremarkable. Nails are tender, long, thickened and dystrophic with subungual debris, consistent with onychomycosis, 1-5 bilateral. No signs of infection noted. Vasc  DP and PT pedal pulses palpable bilaterally. Temperature gradient within normal limits.  Neuro Epicritic and protective threshold sensation diminished bilaterally.  Musculoskeletal Exam No symptomatic pedal deformities noted bilateral. Muscular strength within normal limits.  ASSESSMENT 1. Onychodystrophic nails 1-5 bilateral with hyperkeratosis of nails.  2. Onychomycosis of nail due to dermatophyte bilateral 3. Pain in foot bilateral  PLAN OF CARE 1. Patient evaluated today.  2. Instructed to maintain good pedal hygiene and foot care.  3. Mechanical debridement of nails 1-5 bilaterally performed using a nail nipper. Filed with dremel without incident.  4. Return to clinic in 3 mos.    Brent M. Evans, DPM [facility]  Dr. Brent M. Evans, DPM    [address]                                        [place], [place] [address]                Office [phone]  Fax [phone]      

## 2017-07-11 ENCOUNTER — Ambulatory Visit: Payer: Medicare Other | Admitting: Podiatry

## 2017-07-19 DIAGNOSIS — D1801 Hemangioma of skin and subcutaneous tissue: Secondary | ICD-10-CM | POA: Diagnosis not present

## 2017-07-19 DIAGNOSIS — Z85828 Personal history of other malignant neoplasm of skin: Secondary | ICD-10-CM | POA: Diagnosis not present

## 2017-07-19 DIAGNOSIS — L821 Other seborrheic keratosis: Secondary | ICD-10-CM | POA: Diagnosis not present

## 2017-07-19 DIAGNOSIS — L720 Epidermal cyst: Secondary | ICD-10-CM | POA: Diagnosis not present

## 2017-08-11 ENCOUNTER — Encounter: Payer: Self-pay | Admitting: Internal Medicine

## 2017-08-11 ENCOUNTER — Ambulatory Visit (INDEPENDENT_AMBULATORY_CARE_PROVIDER_SITE_OTHER): Payer: Medicare Other | Admitting: Internal Medicine

## 2017-08-11 VITALS — BP 120/76 | HR 70 | Temp 97.5°F | Wt 72.5 lb

## 2017-08-11 DIAGNOSIS — R5383 Other fatigue: Secondary | ICD-10-CM | POA: Diagnosis not present

## 2017-08-11 DIAGNOSIS — F0280 Dementia in other diseases classified elsewhere without behavioral disturbance: Secondary | ICD-10-CM | POA: Diagnosis not present

## 2017-08-11 DIAGNOSIS — E440 Moderate protein-calorie malnutrition: Secondary | ICD-10-CM | POA: Diagnosis not present

## 2017-08-11 DIAGNOSIS — G301 Alzheimer's disease with late onset: Secondary | ICD-10-CM

## 2017-08-11 MED ORDER — MIRTAZAPINE 15 MG PO TABS: 7.5000 mg | ORAL_TABLET | Freq: Every day | ORAL | 3 refills | Status: DC

## 2017-08-11 NOTE — Assessment & Plan Note (Signed)
Seems to be related to her dementia diagnosis Labs okay No new PE findings

## 2017-08-11 NOTE — Assessment & Plan Note (Signed)
Continued progression Discussed getting more help---evaluation by [facility] and social worker

## 2017-08-11 NOTE — Progress Notes (Signed)
   Subjective:    Patient ID: Kathryn Hodges, female    DOB: [birth year], 83 y.o.   MRN: [id number]  HPI Here with daughter with ongoing fatigue  She doesn't really complain Dementia has been progressively worsening Has weak spells, mostly in the morning  Doesn't drive Husband is doing most of the instrumental ADLs She helps with laundry--but does it wrong Independent with ADLs and still generally continent Inappropriate behaviors at times--like feeding spaghetti to the birds  Not eating much Husband does try to get her to eat Has ensure--not sure how much she is drinking  No apparent depression Some agitated spells--- ?gets frustrated (daughter not sure about this)  Current Outpatient Prescriptions on File Prior to Visit  Medication Sig Dispense Refill  . Multiple Vitamins-Minerals (ONE-A-DAY WOMENS 50+ ADVANTAGE) TABS Take by mouth daily.     No current facility-administered medications on file prior to visit.     Allergies  Allergen Reactions  . Sulfa Antibiotics Hives, Itching, Rash and Other (See Comments)    Other Reaction: Other reaction Other Reaction: Other reaction    Past Medical History:  Diagnosis Date  . Cancer ([place])    squamous cell skin ca  . Dementia   . Osteoporosis, senile   . Patella fracture    Left 2014, bilateral 9/15  . Spastic dysphonia     Past Surgical History:  Procedure Laterality Date  . ROTATOR CUFF REPAIR Left   . SKIN CANCER EXCISION     SCC on scalp  . VAGINAL HYSTERECTOMY      Family History  Problem Relation Age of Onset  . Dementia Brother     Social History   Social History  . Marital status: Married    Spouse name: N/A  . Number of children: 4  . Years of education: N/A   Occupational History  . Electronics engineer     Retired   Social History Main Topics  . Smoking status: Never Smoker  . Smokeless tobacco: Never Used  . Alcohol use No  . Drug use: No  . Sexual activity: No   Other  Topics Concern  . Not on file   Social History Narrative   No living will   Requests husband as health care POA   Would accept resuscitation attempts   Not sure about tube feeds   Review of Systems Bowels are fine No chest pain No SOB Voids okay---off the bladder med    Objective:   Physical Exam  Constitutional:  Clear wasting  Neck: No thyromegaly present.  Cardiovascular: Normal rate, regular rhythm and normal heart sounds.  Exam reveals no gallop.   No murmur heard. Pulmonary/Chest: Effort normal and breath sounds normal. No respiratory distress. She has no wheezes. She has no rales.  Abdominal: Soft. There is no tenderness.  Lymphadenopathy:    She has no cervical adenopathy.  Psychiatric: She has a normal mood and affect. Her behavior is normal.          Assessment & Plan:

## 2017-08-11 NOTE — Patient Instructions (Signed)
Please order meals daily from the [facility]. Increase the ensure supplements (at least 1 and maybe 2 per day). Consider trying the appetite stimulant (mirtazapine) and start at 1/2 tab a day and increase to a full tab if no problems (like excessive daytime sleepiness in AM).

## 2017-08-11 NOTE — Assessment & Plan Note (Signed)
Has lost more weight Asked daughter to get them to order meals from the [facility] daily Increase the ensure Discussed mirtazapine---will send Rx and she will discuss with patient's husband (her dad)

## 2017-08-30 ENCOUNTER — Telehealth: Payer: Self-pay | Admitting: Internal Medicine

## 2017-08-30 DIAGNOSIS — E440 Moderate protein-calorie malnutrition: Secondary | ICD-10-CM

## 2017-08-30 NOTE — Telephone Encounter (Signed)
Mr. Kathryn Hodges called because the pt is scheduled for an appt on 10/12 and since her last appt in sept she has lost weight and is now down to 72 lbs.  They would like to work on nutrition and his request is that blood work is completed as she been iron deficient in the past.  She is also experiencing weakness by mid morning.  He also wants to know if urinalysis is needed.  Can you please review and order prior to the 12th if appropriate. [phone] is the best number to reach Mr. Oconnor.

## 2017-08-31 NOTE — Telephone Encounter (Signed)
I spoke to patient's husband and he scheduled appointment on 09/01/17 for lab work.

## 2017-08-31 NOTE — Telephone Encounter (Signed)
Labs have been ordered. Please set her up for lab appt in the next week (before the appt coming up)

## 2017-09-01 ENCOUNTER — Other Ambulatory Visit: Payer: Medicare Other

## 2017-09-01 ENCOUNTER — Other Ambulatory Visit (INDEPENDENT_AMBULATORY_CARE_PROVIDER_SITE_OTHER): Payer: Medicare Other

## 2017-09-01 DIAGNOSIS — E440 Moderate protein-calorie malnutrition: Secondary | ICD-10-CM

## 2017-09-01 LAB — COMPREHENSIVE METABOLIC PANEL
ALBUMIN: 4.3 g/dL (ref 3.5–5.2)
ALK PHOS: 68 U/L (ref 39–117)
ALT: 13 U/L (ref 0–35)
AST: 19 U/L (ref 0–37)
BILIRUBIN TOTAL: 0.4 mg/dL (ref 0.2–1.2)
BUN: 23 mg/dL (ref 6–23)
CALCIUM: 9.7 mg/dL (ref 8.4–10.5)
CO2: 30 mEq/L (ref 19–32)
CREATININE: 0.72 mg/dL (ref 0.40–1.20)
Chloride: 102 mEq/L (ref 96–112)
GFR: 82.04 mL/min (ref >60.00)
Glucose, Bld: 92 mg/dL (ref 70–99)
Potassium: 4.1 mEq/L (ref 3.5–5.1)
Sodium: 138 mEq/L (ref 135–145)
TOTAL PROTEIN: 7.5 g/dL (ref 6.0–8.3)

## 2017-09-01 LAB — CBC WITH DIFFERENTIAL/PLATELET
BASOS ABS: 0 10*3/uL (ref 0.0–0.1)
BASOS PCT: 0.6 % (ref 0.0–3.0)
EOS ABS: 0.1 10*3/uL (ref 0.0–0.7)
Eosinophils Relative: 1.1 % (ref 0.0–5.0)
HEMATOCRIT: 36.3 % (ref 36.0–46.0)
HEMOGLOBIN: 12 g/dL (ref 12.0–15.0)
LYMPHS PCT: 18.2 % (ref 12.0–46.0)
Lymphs Abs: 0.9 10*3/uL (ref 0.7–4.0)
MCHC: 32.9 g/dL (ref 30.0–36.0)
MCV: 101.2 fl — AB (ref 78.0–100.0)
MONOS PCT: 14 % — AB (ref 3.0–12.0)
Monocytes Absolute: 0.7 10*3/uL (ref 0.1–1.0)
NEUTROS ABS: 3.3 10*3/uL (ref 1.4–7.7)
Neutrophils Relative %: 66.1 % (ref 43.0–77.0)
PLATELETS: 217 10*3/uL (ref 150.0–400.0)
RBC: 3.59 Mil/uL — ABNORMAL LOW (ref 3.87–5.11)
RDW: 14.2 % (ref 11.5–15.5)
WBC: 5 10*3/uL (ref 4.0–10.5)

## 2017-09-01 LAB — T4, FREE: Free T4: 0.85 ng/dL (ref 0.60–1.60)

## 2017-09-01 LAB — VITAMIN B12: VITAMIN B 12: 921 pg/mL — AB (ref 211–911)

## 2017-09-09 ENCOUNTER — Ambulatory Visit (INDEPENDENT_AMBULATORY_CARE_PROVIDER_SITE_OTHER): Payer: Medicare Other | Admitting: Internal Medicine

## 2017-09-09 ENCOUNTER — Encounter: Payer: Self-pay | Admitting: Internal Medicine

## 2017-09-09 VITALS — BP 110/70 | HR 86 | Temp 97.3°F | Wt 74.0 lb

## 2017-09-09 DIAGNOSIS — E440 Moderate protein-calorie malnutrition: Secondary | ICD-10-CM | POA: Diagnosis not present

## 2017-09-09 DIAGNOSIS — F0280 Dementia in other diseases classified elsewhere without behavioral disturbance: Secondary | ICD-10-CM | POA: Diagnosis not present

## 2017-09-09 DIAGNOSIS — G301 Alzheimer's disease with late onset: Secondary | ICD-10-CM

## 2017-09-09 DIAGNOSIS — Z23 Encounter for immunization: Secondary | ICD-10-CM | POA: Diagnosis not present

## 2017-09-09 NOTE — Assessment & Plan Note (Signed)
No major decline functionally Husband doing okay as caregiver No need for other resources at this point

## 2017-09-09 NOTE — Addendum Note (Signed)
Addended by: Pilar Grammes on: 09/09/2017 01:06 PM   Modules accepted: Orders

## 2017-09-09 NOTE — Assessment & Plan Note (Signed)
I think this is the cause of the AM weakness Did gain back 1.5# Discussed making shakes with ensure Sugared cereal in AM, etc

## 2017-09-09 NOTE — Progress Notes (Signed)
   Subjective:    Patient ID: Kathryn Hodges, female    DOB: 1931/04/18, 83 y.o.   MRN: [id number]  HPI Here with daughter and husband--- for follow up of dementia Reviewed recent blood work---all fine  She feels she is doing okay Husband decided not to try the mirtazapine They are getting meals from [facility] and [employer] Has ensure--but she is not excited about these. Probably 1 every other day  Sleeps well Weak every morning-- has to sit for 2-3 hours in the morning States "I feel drained of energy"--picks up around noon Doesn't really eat much breakfast---some cereal only usually  She does okay when husband is out He doesn't go out for long--but plays corn hole, walks a mile every evening Did have [place] nurse come and discussed resources  Still independent with ADLs No sig instrumental ADLs other than helping with dishes  Current Outpatient Prescriptions on File Prior to Visit  Medication Sig Dispense Refill  . Multiple Vitamins-Minerals (ONE-A-DAY WOMENS 50+ ADVANTAGE) TABS Take by mouth daily.    . Pediatric Multivit-Minerals-C (FLINTSTONES COMPLETE PO) Take 1 tablet by mouth daily.     No current facility-administered medications on file prior to visit.     Allergies  Allergen Reactions  . Sulfa Antibiotics Hives, Itching, Rash and Other (See Comments)    Other Reaction: Other reaction Other Reaction: Other reaction    Past Medical History:  Diagnosis Date  . Cancer ([place])    squamous cell skin ca  . Dementia   . Osteoporosis, senile   . Patella fracture    Left 2014, bilateral 9/15  . Spastic dysphonia     Past Surgical History:  Procedure Laterality Date  . ROTATOR CUFF REPAIR Left   . SKIN CANCER EXCISION     SCC on scalp  . VAGINAL HYSTERECTOMY      Family History  Problem Relation Age of Onset  . Dementia Brother     Social History   Social History  . Marital status: Married    Spouse name: N/A  . Number of children: 4  . Years of education:  N/A   Occupational History  . Electronics engineer     Retired   Social History Main Topics  . Smoking status: Never Smoker  . Smokeless tobacco: Never Used  . Alcohol use No  . Drug use: No  . Sexual activity: No   Other Topics Concern  . Not on file   Social History Narrative   No living will   Requests husband as health care POA   Would accept resuscitation attempts   Not sure about tube feeds   Review of Systems  Weight up 1.5# since last visit No skin problems     Objective:   Physical Exam  Constitutional: No distress.  Neurological:  Fairly passive but does answer questions          Assessment & Plan:

## 2017-10-04 ENCOUNTER — Ambulatory Visit (INDEPENDENT_AMBULATORY_CARE_PROVIDER_SITE_OTHER): Payer: Medicare Other | Admitting: Podiatry

## 2017-10-04 ENCOUNTER — Encounter: Payer: Self-pay | Admitting: Podiatry

## 2017-10-04 ENCOUNTER — Ambulatory Visit: Payer: Medicare Other | Admitting: Podiatry

## 2017-10-04 DIAGNOSIS — M79676 Pain in unspecified toe(s): Secondary | ICD-10-CM

## 2017-10-04 DIAGNOSIS — B351 Tinea unguium: Secondary | ICD-10-CM | POA: Diagnosis not present

## 2017-10-06 NOTE — Progress Notes (Signed)
   SUBJECTIVE Patient presents to office today complaining of elongated, thickened nails. Pain while ambulating in shoes. Patient is unable to trim their own nails.   Past Medical History:  Diagnosis Date  . Cancer ([place])    squamous cell skin ca  . Dementia   . Osteoporosis, senile   . Patella fracture    Left 2014, bilateral 9/15  . Spastic dysphonia     OBJECTIVE General Patient is awake, alert, and oriented x 3 and in no acute distress. Derm Skin is dry and supple bilateral. Negative open lesions or macerations. Remaining integument unremarkable. Nails are tender, long, thickened and dystrophic with subungual debris, consistent with onychomycosis, 1-5 bilateral. No signs of infection noted. Vasc  DP and PT pedal pulses palpable bilaterally. Temperature gradient within normal limits.  Neuro Epicritic and protective threshold sensation diminished bilaterally.  Musculoskeletal Exam No symptomatic pedal deformities noted bilateral. Muscular strength within normal limits.  ASSESSMENT 1. Onychodystrophic nails 1-5 bilateral with hyperkeratosis of nails.  2. Onychomycosis of nail due to dermatophyte bilateral 3. Pain in foot bilateral  PLAN OF CARE 1. Patient evaluated today.  2. Instructed to maintain good pedal hygiene and foot care.  3. Mechanical debridement of nails 1-5 bilaterally performed using a nail nipper. Filed with dremel without incident.  4. Return to clinic in 3 mos.    Edrick Kins, DPM [facility]  Dr. Edrick Kins, [address]                                        [place], [place] [address]                Office [phone]  Fax [phone]

## 2017-12-15 ENCOUNTER — Ambulatory Visit (INDEPENDENT_AMBULATORY_CARE_PROVIDER_SITE_OTHER): Payer: Medicare Other | Admitting: Internal Medicine

## 2017-12-15 ENCOUNTER — Encounter: Payer: Self-pay | Admitting: Internal Medicine

## 2017-12-15 VITALS — BP 114/70 | HR 86 | Temp 97.9°F | Wt 79.4 lb

## 2017-12-15 DIAGNOSIS — G301 Alzheimer's disease with late onset: Secondary | ICD-10-CM

## 2017-12-15 DIAGNOSIS — E440 Moderate protein-calorie malnutrition: Secondary | ICD-10-CM | POA: Diagnosis not present

## 2017-12-15 DIAGNOSIS — F0280 Dementia in other diseases classified elsewhere without behavioral disturbance: Secondary | ICD-10-CM | POA: Diagnosis not present

## 2017-12-15 NOTE — Assessment & Plan Note (Signed)
Slight cognitive decline but functionally stable

## 2017-12-15 NOTE — Progress Notes (Signed)
   Subjective:    Patient ID: Kathryn Hodges, female    DOB: 1931-10-19, 84 y.o.   MRN: [id number]  HPI Here for follow up of poor nutrition and Alzheimer's  Has gained 5# since last visit She has made a real effort--eating ice cream daily, etc  Memory issues are slightly worse--per husband No functional decline--ADLs still independent Husband will still leave for brief periods of time  Current Outpatient Medications on File Prior to Visit  Medication Sig Dispense Refill  . Multiple Vitamins-Minerals (ONE-A-DAY WOMENS 50+ ADVANTAGE) TABS Take by mouth daily.    . Pediatric Multivit-Minerals-C (FLINTSTONES COMPLETE PO) Take 1 tablet by mouth daily.     No current facility-administered medications on file prior to visit.     Allergies  Allergen Reactions  . Sulfa Antibiotics Hives, Itching, Rash and Other (See Comments)    Other Reaction: Other reaction Other Reaction: Other reaction Other reaction(s): Other (See Comments) Other Reaction: Other reaction    Past Medical History:  Diagnosis Date  . Cancer ([place])    squamous cell skin ca  . Dementia   . Osteoporosis, senile   . Patella fracture    Left 2014, bilateral 9/15  . Spastic dysphonia     Past Surgical History:  Procedure Laterality Date  . ROTATOR CUFF REPAIR Left   . SKIN CANCER EXCISION     SCC on scalp  . VAGINAL HYSTERECTOMY      Family History  Problem Relation Age of Onset  . Dementia Brother     Social History   Socioeconomic History  . Marital status: Married    Spouse name: Not on file  . Number of children: 4  . Years of education: Not on file  . Highest education level: Not on file  Social Needs  . Financial resource strain: Not on file  . Food insecurity - worry: Not on file  . Food insecurity - inability: Not on file  . Transportation needs - medical: Not on file  . Transportation needs - non-medical: Not on file  Occupational History  . Occupation: Museum/gallery conservator    Comment: Retired  Tobacco Use  . Smoking status: Never Smoker  . Smokeless tobacco: Never Used  Substance and Sexual Activity  . Alcohol use: No    Alcohol/week: 0.0 oz  . Drug use: No  . Sexual activity: No  Other Topics Concern  . Not on file  Social History Narrative   No living will   Requests husband as health care POA   Would accept resuscitation attempts   Not sure about tube feeds   ROS: Sleeps well Occasional bad snoring--- husband has noted brief apnea (under 10 seconds) AM energy is some better lately    Objective:   Physical Exam  Constitutional: No distress.  Neck: No thyromegaly present.  Cardiovascular: Normal rate, regular rhythm and normal heart sounds. Exam reveals no gallop.  No murmur heard. Pulmonary/Chest: Effort normal and breath sounds normal. No respiratory distress. She has no wheezes. She has no rales.  Musculoskeletal: She exhibits no edema.  Lymphadenopathy:    She has no cervical adenopathy.  Psychiatric: She has a normal mood and affect. Her behavior is normal.            Assessment & Plan:

## 2017-12-15 NOTE — Assessment & Plan Note (Signed)
Weight up some Discussed protein and leg strengthening

## 2017-12-15 NOTE — Patient Instructions (Signed)
Please talk to Kathryn Hodges about some leg strengthening exercises

## 2018-01-06 ENCOUNTER — Encounter: Payer: Self-pay | Admitting: Podiatry

## 2018-01-06 ENCOUNTER — Ambulatory Visit (INDEPENDENT_AMBULATORY_CARE_PROVIDER_SITE_OTHER): Payer: Medicare Other | Admitting: Podiatry

## 2018-01-06 DIAGNOSIS — M79676 Pain in unspecified toe(s): Secondary | ICD-10-CM

## 2018-01-06 DIAGNOSIS — B351 Tinea unguium: Secondary | ICD-10-CM | POA: Diagnosis not present

## 2018-01-09 NOTE — Progress Notes (Signed)
   SUBJECTIVE Patient presents to office today complaining of elongated, thickened nails. Pain while ambulating in shoes. Patient is unable to trim their own nails.   Past Medical History:  Diagnosis Date  . Cancer ([place])    squamous cell skin ca  . Dementia   . Osteoporosis, senile   . Patella fracture    Left 2014, bilateral 9/15  . Spastic dysphonia     OBJECTIVE General Patient is awake, alert, and oriented x 3 and in no acute distress. Derm Skin is dry and supple bilateral. Negative open lesions or macerations. Remaining integument unremarkable. Nails are tender, long, thickened and dystrophic with subungual debris, consistent with onychomycosis, 1-5 bilateral. No signs of infection noted. Vasc  DP and PT pedal pulses palpable bilaterally. Temperature gradient within normal limits.  Neuro Epicritic and protective threshold sensation diminished bilaterally.  Musculoskeletal Exam No symptomatic pedal deformities noted bilateral. Muscular strength within normal limits.  ASSESSMENT 1. Onychodystrophic nails 1-5 bilateral with hyperkeratosis of nails.  2. Onychomycosis of nail due to dermatophyte bilateral 3. Pain in foot bilateral  PLAN OF CARE 1. Patient evaluated today.  2. Instructed to maintain good pedal hygiene and foot care.  3. Mechanical debridement of nails 1-5 bilaterally performed using a nail nipper. Filed with dremel without incident.  4. Return to clinic in 3 mos.    Edrick Kins, DPM [facility]  Dr. Edrick Kins, [address]                                        [place], [place] [address]                Office [phone]  Fax [phone]

## 2018-02-20 DIAGNOSIS — H02052 Trichiasis without entropian right lower eyelid: Secondary | ICD-10-CM | POA: Diagnosis not present

## 2018-04-07 ENCOUNTER — Ambulatory Visit: Payer: Medicare Other | Admitting: Podiatry

## 2018-04-07 ENCOUNTER — Emergency Department
Admission: EM | Admit: 2018-04-07 | Discharge: 2018-04-07 | Disposition: A | Discharge status: Home / Self Care | Payer: Medicare Other | Attending: Emergency Medicine | Admitting: Emergency Medicine

## 2018-04-07 ENCOUNTER — Encounter: Payer: Self-pay | Admitting: Emergency Medicine

## 2018-04-07 DIAGNOSIS — R300 Dysuria: Secondary | ICD-10-CM | POA: Diagnosis not present

## 2018-04-07 DIAGNOSIS — Z85820 Personal history of malignant melanoma of skin: Secondary | ICD-10-CM | POA: Insufficient documentation

## 2018-04-07 DIAGNOSIS — N3000 Acute cystitis without hematuria: Secondary | ICD-10-CM

## 2018-04-07 DIAGNOSIS — R109 Unspecified abdominal pain: Secondary | ICD-10-CM | POA: Diagnosis not present

## 2018-04-07 DIAGNOSIS — N3090 Cystitis, unspecified without hematuria: Secondary | ICD-10-CM | POA: Insufficient documentation

## 2018-04-07 DIAGNOSIS — F0390 Unspecified dementia without behavioral disturbance: Secondary | ICD-10-CM | POA: Diagnosis not present

## 2018-04-07 DIAGNOSIS — Z79899 Other long term (current) drug therapy: Secondary | ICD-10-CM | POA: Insufficient documentation

## 2018-04-07 LAB — URINALYSIS, COMPLETE (UACMP) WITH MICROSCOPIC
Bilirubin Urine: NEGATIVE
GLUCOSE, UA: 50 mg/dL — AB
Hgb urine dipstick: NEGATIVE
Ketones, ur: NEGATIVE mg/dL
Nitrite: NEGATIVE
PH: 5 (ref 5.0–8.0)
Protein, ur: NEGATIVE mg/dL
Specific Gravity, Urine: 1.015 (ref 1.005–1.030)
WBC, UA: >50 WBC/hpf — ABNORMAL HIGH (ref 0–5)

## 2018-04-07 MED ORDER — CIPROFLOXACIN HCL 500 MG PO TABS: 500.0000 mg | ORAL_TABLET | Freq: Two times a day (BID) | ORAL | 0 refills | Status: DC

## 2018-04-07 MED ORDER — CIPROFLOXACIN HCL 500 MG PO TABS
500.0000 mg | ORAL_TABLET | Freq: Once | ORAL | Status: AC
  Administered 2018-04-07: 500 mg via ORAL
  Filled 2018-04-07: qty 1

## 2018-04-07 NOTE — ED Provider Notes (Signed)
[facility] Emergency Department Provider Note  ____________________________________________  Time seen: Approximately 8:10 PM  I have reviewed the triage vital signs and the nursing notes.   HISTORY  Chief Complaint Dysuria    HPI Kathryn Hodges is a 84 y.o. female who presents to the emergency department for treatment and evaluation of abdominal pain for the past 2 days with some increased urinary frequency.  She has also had some dysuria.  She had a history of frequent urinary tract infections last year, but this year has not had any at all per her husband.  Past Medical History:  Diagnosis Date  . Cancer ([place])    squamous cell skin ca  . Dementia   . Osteoporosis, senile   . Patella fracture    Left 2014, bilateral 9/15  . Spastic dysphonia     Patient Active Problem List   Diagnosis Date Noted  . Anterior knee pain 07/05/2017  . Preventative health care 08/16/2016  . Advance directive discussed with patient 08/16/2016  . Malnutrition of moderate degree ([place]) 07/02/2016  . Spastic dysphonia   . Fatigue 03/06/2015  . Alzheimer's dementia   . Chronic cystitis 10/17/2013    Past Surgical History:  Procedure Laterality Date  . ROTATOR CUFF REPAIR Left   . SKIN CANCER EXCISION     SCC on scalp  . VAGINAL HYSTERECTOMY      Prior to Admission medications   Medication Sig Start Date End Date Taking? Authorizing Provider  ciprofloxacin (CIPRO) 500 MG tablet Take 1 tablet (500 mg total) by mouth 2 (two) times daily. 04/07/18   Germani Gavilanes, Dessa Phi, FNP  Multiple Vitamins-Minerals (ONE-A-DAY WOMENS 50+ ADVANTAGE) TABS Take by mouth daily.    [provider]  Pediatric Multivit-Minerals-C (FLINTSTONES COMPLETE PO) Take 1 tablet by mouth daily.    [provider]    Allergies Sulfa antibiotics  Family History  Problem Relation Age of Onset  . Dementia Brother     Social History Social History   Tobacco Use  . Smoking  status: Never Smoker  . Smokeless tobacco: Never Used  Substance Use Topics  . Alcohol use: No    Alcohol/week: 0.0 oz  . Drug use: No    Review of Systems Constitutional: Negative for fever. Respiratory: Negative for shortness of breath or cough. Gastrointestinal: Positive for abdominal pain; negative for nausea , negative for vomiting. Genitourinary: Positive for dysuria , negative for vaginal discharge. Musculoskeletal: Negative for back pain. Skin: Negative for rash, lesion, or wound.. ____________________________________________   PHYSICAL EXAM:  VITAL SIGNS: ED Triage Vitals [04/07/18 1940]  Enc Vitals Group     BP (!) 130/96     Pulse Rate 93     Resp 16     Temp 98 F (36.7 C)     Temp Source Oral     SpO2 97 %     Weight      Height      Head Circumference      Peak Flow      Pain Score 4     Pain Loc      Pain Edu?      Excl. in [place]?     Constitutional: Alert and oriented. Well appearing and in no acute distress. Eyes: Conjunctivae are normal. Head: Atraumatic. Nose: No congestion/rhinnorhea. Mouth/Throat: Mucous membranes are moist. Respiratory: Normal respiratory effort.  No retractions. Gastrointestinal: Suprapubic tenderness on exam. Genitourinary: Pelvic exam: Not indicated Musculoskeletal: No extremity tenderness nor edema.  Neurologic:  Normal speech and language. No gross focal neurologic deficits are appreciated. Speech is normal. No gait instability. Skin:  Skin is warm, dry and intact. No rash noted. Psychiatric: Mood and affect are normal. Speech and behavior are normal.  ____________________________________________   LABS (all labs ordered are listed, but only abnormal results are displayed)  Labs Reviewed  URINALYSIS, COMPLETE (UACMP) WITH MICROSCOPIC - Abnormal; Notable for the following components:      Result Value   Color, Urine YELLOW (*)    APPearance HAZY (*)    Glucose, UA 50 (*)    Leukocytes, UA MODERATE (*)    WBC, UA  >50 (*)    Bacteria, UA FEW (*)    All other components within normal limits  URINE CULTURE   ____________________________________________  RADIOLOGY  Not indicated ____________________________________________   PROCEDURES  Procedure(s) performed: None  ____________________________________________  84 year old female presenting to the emergency department for treatment and evaluation of abdominal pain for the past couple of days.  Over the past year, she has not had any urinary tract infections but prior to that she had multiple UTIs back to back.  She was patient with Dr. Jacqlyn Larsen.  On review of the visit with Dr. Jacqlyn Larsen in May 2018, it appears that ciprofloxacin was the antibiotic of choice to treat the UTI.  Today, she will be given a prescription for ciprofloxacin and a urine culture has been requested.  Husband states that they have a follow-up appointment with Dr. Jacqlyn Larsen in June.  He was advised to call to see if an earlier appointment can be scheduled.  If not and she continues to have pain, she is to see her primary care provider or return to the emergency department.  INITIAL IMPRESSION / ASSESSMENT AND PLAN / ED COURSE  Pertinent labs & imaging results that were available during my care of the patient were reviewed by me and considered in my medical decision making (see chart for details).  ____________________________________________   FINAL CLINICAL IMPRESSION(S) / ED DIAGNOSES  Final diagnoses:  Acute cystitis without hematuria    Note:  This document was prepared using Dragon voice recognition software and may include unintentional dictation errors.    Victorino Dike, FNP 04/07/18 2204    Arta Silence, MD 04/07/18 2215

## 2018-04-07 NOTE — ED Triage Notes (Signed)
Pt states she has been having abdominal pain over the last couple of days and has increased urinary frequency.  Pt's husband states "she has had a lot of UTI's but in the last year has not had one"  Pt states she has burning sensation while voiding.  Pt denied any medical hx regarding abdominal area such as fistulas, gastric lap band, etc.

## 2018-04-10 LAB — URINE CULTURE: Culture: >=100000 — AB

## 2018-04-11 ENCOUNTER — Telehealth: Payer: Self-pay

## 2018-04-11 NOTE — Telephone Encounter (Signed)
Still on her antibiotics for 2 more days. Seems to be much better. Appreciated the call.

## 2018-04-18 ENCOUNTER — Ambulatory Visit (INDEPENDENT_AMBULATORY_CARE_PROVIDER_SITE_OTHER): Payer: Medicare Other | Admitting: Podiatry

## 2018-04-18 ENCOUNTER — Encounter: Payer: Self-pay | Admitting: Podiatry

## 2018-04-18 DIAGNOSIS — M79676 Pain in unspecified toe(s): Secondary | ICD-10-CM

## 2018-04-18 DIAGNOSIS — B351 Tinea unguium: Secondary | ICD-10-CM | POA: Diagnosis not present

## 2018-04-20 NOTE — Progress Notes (Signed)
   SUBJECTIVE Patient presents to office today complaining of elongated, thickened nails that cause pain while ambulating in shoes. She is unable to trim her own nails. Patient is here for further evaluation and treatment.  Past Medical History:  Diagnosis Date  . Cancer ([facility])    squamous cell skin ca  . Dementia   . Osteoporosis, senile   . Patella fracture    Left 2014, bilateral 9/15  . Spastic dysphonia     OBJECTIVE General Patient is awake, alert, and oriented x 3 and in no acute distress. Derm Skin is dry and supple bilateral. Negative open lesions or macerations. Remaining integument unremarkable. Nails are tender, long, thickened and dystrophic with subungual debris, consistent with onychomycosis, 1-5 bilateral. No signs of infection noted. Vasc  DP and PT pedal pulses palpable bilaterally. Temperature gradient within normal limits.  Neuro Epicritic and protective threshold sensation grossly intact bilaterally.  Musculoskeletal Exam No symptomatic pedal deformities noted bilateral. Muscular strength within normal limits.  ASSESSMENT 1. Onychodystrophic nails 1-5 bilateral with hyperkeratosis of nails.  2. Onychomycosis of nail due to dermatophyte bilateral 3. Pain in foot bilateral  PLAN OF CARE 1. Patient evaluated today.  2. Instructed to maintain good pedal hygiene and foot care.  3. Mechanical debridement of nails 1-5 bilaterally performed using a nail nipper. Filed with dremel without incident.  4. Return to clinic in 3 mos.    Brent M. Evans, DPM [facility]  Dr. Brent M. Evans, DPM    [address]                                        [place], [place] [address]                Office [phone]  Fax [phone]     

## 2018-04-28 ENCOUNTER — Other Ambulatory Visit: Payer: Self-pay

## 2018-04-28 ENCOUNTER — Encounter: Payer: Self-pay | Admitting: Emergency Medicine

## 2018-04-28 ENCOUNTER — Emergency Department
Admission: EM | Admit: 2018-04-28 | Discharge: 2018-04-28 | Disposition: A | Discharge status: Home / Self Care | Payer: Medicare Other | Attending: Emergency Medicine | Admitting: Emergency Medicine

## 2018-04-28 DIAGNOSIS — Z85828 Personal history of other malignant neoplasm of skin: Secondary | ICD-10-CM | POA: Insufficient documentation

## 2018-04-28 DIAGNOSIS — G309 Alzheimer's disease, unspecified: Secondary | ICD-10-CM | POA: Insufficient documentation

## 2018-04-28 DIAGNOSIS — F0280 Dementia in other diseases classified elsewhere without behavioral disturbance: Secondary | ICD-10-CM | POA: Insufficient documentation

## 2018-04-28 DIAGNOSIS — R350 Frequency of micturition: Secondary | ICD-10-CM | POA: Insufficient documentation

## 2018-04-28 LAB — URINALYSIS, COMPLETE (UACMP) WITH MICROSCOPIC
BACTERIA UA: NONE SEEN
Bilirubin Urine: NEGATIVE
GLUCOSE, UA: NEGATIVE mg/dL
Hgb urine dipstick: NEGATIVE
Ketones, ur: NEGATIVE mg/dL
Leukocytes, UA: NEGATIVE
Nitrite: NEGATIVE
PH: 5 (ref 5.0–8.0)
Protein, ur: 30 mg/dL — AB
Specific Gravity, Urine: 1.019 (ref 1.005–1.030)

## 2018-04-28 LAB — BASIC METABOLIC PANEL
ANION GAP: 5 (ref 5–15)
BUN: 22 mg/dL — ABNORMAL HIGH (ref 6–20)
CALCIUM: 9.6 mg/dL (ref 8.9–10.3)
CO2: 29 mmol/L (ref 22–32)
Chloride: 106 mmol/L (ref 101–111)
Creatinine, Ser: 0.73 mg/dL (ref 0.44–1.00)
GFR calc non Af Amer: >60 mL/min (ref >60)
GLUCOSE: 98 mg/dL (ref 65–99)
POTASSIUM: 4.7 mmol/L (ref 3.5–5.1)
Sodium: 140 mmol/L (ref 135–145)

## 2018-04-28 LAB — CBC
HEMATOCRIT: 36.2 % (ref 35.0–47.0)
HEMOGLOBIN: 12.4 g/dL (ref 12.0–16.0)
MCH: 34 pg (ref 26.0–34.0)
MCHC: 34.2 g/dL (ref 32.0–36.0)
MCV: 99.4 fL (ref 80.0–100.0)
Platelets: 218 10*3/uL (ref 150–440)
RBC: 3.64 MIL/uL — AB (ref 3.80–5.20)
RDW: 13.6 % (ref 11.5–14.5)
WBC: 4 10*3/uL (ref 3.6–11.0)

## 2018-04-28 MED ORDER — CEPHALEXIN 500 MG PO CAPS: 500.0000 mg | ORAL_CAPSULE | Freq: Three times a day (TID) | ORAL | 0 refills | Status: AC

## 2018-04-28 NOTE — ED Provider Notes (Signed)
[facility] Emergency Department Provider Note  ____________________________________________  Time seen: Approximately 6:08 PM  I have reviewed the triage vital signs and the nursing notes.   HISTORY  Chief Complaint Urinary Frequency    HPI [name] presents to the emergency department with increased urinary frequency for the past 2 days.  Patient has a history of mild dementia and is accompanied by her husband who provides some of the historical information. Patient's husband reports that patient typically urinates once in the morning. Patient's husband reports that patient usually and over the past 2 days patient has had  2-3 episodes of urination every hour.  Patient was treated empirically for acute cystitis 3 weeks ago with ciprofloxacin.  Patient's urine culture at that time revealed E. coli growth.  Patient has been reports that after treatment with ciprofloxacin her symptoms apparently resolved.  Patient's husband denies worsening of dementia over the past 2 to 3 days.  Patient has been afebrile and has not been complaining of abdominal pain or back pain.  No vomiting.  Upon questioning, does report that patient has been eating asparagus.   Past Medical History:  Diagnosis Date  . Cancer ([place])    squamous cell skin ca  . Dementia   . Osteoporosis, senile   . Patella fracture    Left 2014, bilateral 9/15  . Spastic dysphonia     Patient Active Problem List   Diagnosis Date Noted  . Anterior knee pain 07/05/2017  . Preventative health care 08/16/2016  . Advance directive discussed with patient 08/16/2016  . Malnutrition of moderate degree ([place]) 07/02/2016  . Spastic dysphonia   . Fatigue 03/06/2015  . Alzheimer's dementia   . Chronic cystitis 10/17/2013    Past Surgical History:  Procedure Laterality Date  . ROTATOR CUFF REPAIR Left   . SKIN CANCER EXCISION     SCC on scalp  . VAGINAL HYSTERECTOMY      Prior  to Admission medications   Medication Sig Start Date End Date Taking? Authorizing Provider  cephALEXin (KEFLEX) 500 MG capsule Take 1 capsule (500 mg total) by mouth 3 (three) times daily for 7 days. 04/28/18 05/05/18  Lannie Fields, PA-C  ciprofloxacin (CIPRO) 500 MG tablet Take 1 tablet (500 mg total) by mouth 2 (two) times daily. 04/07/18   Triplett, Dessa Phi, FNP  Multiple Vitamins-Minerals (ONE-A-DAY WOMENS 50+ ADVANTAGE) TABS Take by mouth daily.    [provider]  Pediatric Multivit-Minerals-C (FLINTSTONES COMPLETE PO) Take 1 tablet by mouth daily.    [provider]    Allergies Sulfa antibiotics  Family History  Problem Relation Age of Onset  . Dementia Brother     Social History Social History   Tobacco Use  . Smoking status: Never Smoker  . Smokeless tobacco: Never Used  Substance Use Topics  . Alcohol use: No    Alcohol/week: 0.0 oz  . Drug use: No     Review of Systems  Constitutional: No fever/chills Eyes: No visual changes. No discharge ENT: No upper respiratory complaints. Cardiovascular: no chest pain. Respiratory: no cough. No SOB. Gastrointestinal: No abdominal pain.  No nausea, no vomiting.  No diarrhea.  No constipation. Genitourinary: Patient has increased urinary frequency.  Musculoskeletal: Negative for musculoskeletal pain. Skin: Negative for rash, abrasions, lacerations, ecchymosis. Neurological: Negative for headaches, focal weakness or numbness.   ____________________________________________   PHYSICAL EXAM:  VITAL SIGNS: ED Triage Vitals  Enc Vitals Group  BP 04/28/18 1542 137/63     Pulse Rate 04/28/18 1540 89     Resp 04/28/18 1540 18     Temp 04/28/18 1540 98.3 F (36.8 C)     Temp Source 04/28/18 1540 Oral     SpO2 04/28/18 1540 99 %     Weight 04/28/18 1544 76 lb 4.8 oz (34.6 kg)     Height 04/28/18 1541 5\' 4"  (1.626 m)     Head Circumference --      Peak Flow --      Pain Score 04/28/18 1646 0     Pain  Loc --      Pain Edu? --      Excl. in [place]? --      Constitutional: Alert and oriented. Well appearing and in no acute distress. Eyes: Conjunctivae are normal. PERRL. EOMI. Head: Atraumatic. Cardiovascular: Normal rate, regular rhythm. Normal S1 and S2.  Good peripheral circulation. Respiratory: Normal respiratory effort without tachypnea or retractions. Lungs CTAB. Good air entry to the bases with no decreased or absent breath sounds. Gastrointestinal: Bowel sounds 4 quadrants. Soft and nontender to palpation. No guarding or rigidity. No palpable masses. No distention. No CVA tenderness. Musculoskeletal: Full range of motion to all extremities. No gross deformities appreciated. Neurologic:  Normal speech and language. No gross focal neurologic deficits are appreciated.  Skin:  Skin is warm, dry and intact. No rash noted. Psychiatric: Mood and affect are normal. Speech and behavior are normal. Patient exhibits appropriate insight and judgement.   ____________________________________________   LABS (all labs ordered are listed, but only abnormal results are displayed)  Labs Reviewed  URINALYSIS, COMPLETE (UACMP) WITH MICROSCOPIC - Abnormal; Notable for the following components:      Result Value   Color, Urine YELLOW (*)    APPearance CLEAR (*)    Protein, ur 30 (*)    All other components within normal limits  BASIC METABOLIC PANEL - Abnormal; Notable for the following components:   BUN 22 (*)    All other components within normal limits  CBC - Abnormal; Notable for the following components:   RBC 3.64 (*)    All other components within normal limits  URINE CULTURE   ____________________________________________  EKG   ____________________________________________  RADIOLOGY   No results found.  ____________________________________________    PROCEDURES  Procedure(s) performed:    Procedures    Medications - No data to  display   ____________________________________________   INITIAL IMPRESSION / ASSESSMENT AND PLAN / ED COURSE  Pertinent labs & imaging results that were available during my care of the patient were reviewed by me and considered in my medical decision making (see chart for details).  Review of the [place] CSRS was performed in accordance of the [place] prior to dispensing any controlled drugs.     Assessment and plan Increased urinary frequency Differential diagnosis included natural diuretic from asparagus consumption and acute cystitis.  Urinalysis was not concerning for acute cystitis.  Urine was sent for culture.  Patient was treated empirically with Keflex and advised to follow-up with primary care.  Patient has an appointment with her primary care provider on May 01, 2018.  Vital signs are reassuring prior to discharge.     ____________________________________________  FINAL CLINICAL IMPRESSION(S) / ED DIAGNOSES  Final diagnoses:  Increased urinary frequency      NEW MEDICATIONS STARTED DURING THIS VISIT:  ED Discharge Orders        Ordered    cephALEXin (KEFLEX) 500 MG  capsule  3 times daily     04/28/18 1730          This chart was dictated using voice recognition software/Dragon. Despite best efforts to proofread, errors can occur which can change the meaning. Any change was purely unintentional.    Lannie Fields, PA-C 04/28/18 1818    Nena Polio, MD 04/28/18 2139

## 2018-04-28 NOTE — ED Triage Notes (Signed)
Pt has had increased urinary frequency per husband and he thinks she has another UTI.  Treated for one 3 weeks ago.  Hx dementia but mental status at baseline per husband. NAD. VSS. No fevers.

## 2018-04-28 NOTE — ED Notes (Signed)
Pt was treated for a UTI 3 weeks ago with Cipro. Pt has had an increase in frequency recently. Pt's spouse stating that this is a similar sx. Pt's spouse denying any altered mental status or changes in personality. Pt has a urology appointment this next week. Pt denying any pain with urination.

## 2018-04-28 NOTE — ED Notes (Signed)
Nurse performed a DTS screening that was negative.

## 2018-04-30 LAB — URINE CULTURE: Culture: NO GROWTH

## 2018-05-03 ENCOUNTER — Telehealth: Payer: Self-pay

## 2018-05-03 NOTE — Telephone Encounter (Signed)
Called pt's husband to check on her after recent ER visit for urinary issues. Per Dr Silvio Pate, if symptoms resolved, and she is doing okay--probably doesn't need follow up.  PEC, if pt calls back, please find out how she is doing and document it. Thanks.

## 2018-05-04 DIAGNOSIS — N200 Calculus of kidney: Secondary | ICD-10-CM | POA: Diagnosis not present

## 2018-05-04 DIAGNOSIS — Q632 Ectopic kidney: Secondary | ICD-10-CM | POA: Diagnosis not present

## 2018-05-04 DIAGNOSIS — N3020 Other chronic cystitis without hematuria: Secondary | ICD-10-CM | POA: Diagnosis not present

## 2018-05-04 DIAGNOSIS — Z90710 Acquired absence of both cervix and uterus: Secondary | ICD-10-CM | POA: Diagnosis not present

## 2018-05-04 DIAGNOSIS — N3941 Urge incontinence: Secondary | ICD-10-CM | POA: Diagnosis not present

## 2018-05-04 DIAGNOSIS — M810 Age-related osteoporosis without current pathological fracture: Secondary | ICD-10-CM | POA: Diagnosis not present

## 2018-05-04 DIAGNOSIS — Z882 Allergy status to sulfonamides status: Secondary | ICD-10-CM | POA: Diagnosis not present

## 2018-05-04 DIAGNOSIS — R350 Frequency of micturition: Secondary | ICD-10-CM | POA: Diagnosis not present

## 2018-05-04 DIAGNOSIS — R413 Other amnesia: Secondary | ICD-10-CM | POA: Diagnosis not present

## 2018-05-04 DIAGNOSIS — R339 Retention of urine, unspecified: Secondary | ICD-10-CM | POA: Diagnosis not present

## 2018-05-04 DIAGNOSIS — R5383 Other fatigue: Secondary | ICD-10-CM | POA: Diagnosis not present

## 2018-05-05 NOTE — Telephone Encounter (Signed)
Pts husband calling back and states that his wife had an appt yesterday with Dr. Jacqlyn Larsen in [place] and that the UTI is better and cleared up. He states her basic condition is still about the same. She gets very fatigue everyday. He said it is very unuasual that she stays in her gown all day and today she did. He states that in the mornings she has to sit down a lot and take breaks, gets some better in the afternoon. He states she is still taking her antibiotics. She is eating pretty good, her weight was 77 lbs yesterday at Dr. Bjorn Loser office. He states he is mostly concerned about her energy level.

## 2018-05-06 NOTE — Telephone Encounter (Signed)
This has been a steady report by him for years I have been unable to figure out any pathology that explains her "fatigue"

## 2018-05-08 ENCOUNTER — Encounter: Payer: Self-pay | Admitting: Internal Medicine

## 2018-05-08 ENCOUNTER — Ambulatory Visit (INDEPENDENT_AMBULATORY_CARE_PROVIDER_SITE_OTHER): Payer: Medicare Other | Admitting: Internal Medicine

## 2018-05-08 VITALS — BP 110/70 | HR 85 | Temp 97.8°F | Ht 62.0 in | Wt 76.0 lb

## 2018-05-08 DIAGNOSIS — E440 Moderate protein-calorie malnutrition: Secondary | ICD-10-CM | POA: Diagnosis not present

## 2018-05-08 DIAGNOSIS — G8929 Other chronic pain: Secondary | ICD-10-CM | POA: Diagnosis not present

## 2018-05-08 DIAGNOSIS — M549 Dorsalgia, unspecified: Secondary | ICD-10-CM | POA: Insufficient documentation

## 2018-05-08 DIAGNOSIS — R5383 Other fatigue: Secondary | ICD-10-CM | POA: Diagnosis not present

## 2018-05-08 DIAGNOSIS — M545 Low back pain: Secondary | ICD-10-CM

## 2018-05-08 NOTE — Assessment & Plan Note (Signed)
Seems to be muscular No kidney stones on KUB Repeat urine culture 5/31 negative Better mostly now Discussed heat and ibuprofen/tylenol

## 2018-05-08 NOTE — Progress Notes (Signed)
Subjective:    Patient ID: Kathryn Hodges, female    DOB: [birth year], 84 y.o.   MRN: [id number]  HPI Here with husband Was seen in the ER and then went to Dr Jacqlyn Larsen 4 days ago Had KUB and no stones seen  Severe pain in abdomen early May and then again 5/31 Urine culture negative at that time--but was started empirically on antibiotic Now just finished cephalexin course  She continues to be "energyless" and washed out Occurs for 2 hours every morning Sleeping more For 2 days has had more severe pain in mid and low back Taking advil-- 1 tab up to every 4 hours (3 total yesterday)--no clear help  She states the pain is not there now--husband states she was complaining about it earlier this morning  Gets up later-- 9AM  Eats light breakfast Soon after---has to sit down and take it easy Then has lunch---then perks up   Reviewed ER records and Dr [employer] records (limited)  Current Outpatient Medications on File Prior to Visit  Medication Sig Dispense Refill  . Pediatric Multivit-Minerals-C (FLINTSTONES COMPLETE PO) Take 1 tablet by mouth daily.     No current facility-administered medications on file prior to visit.     Allergies  Allergen Reactions  . Sulfa Antibiotics Hives, Itching, Rash and Other (See Comments)    Other Reaction: Other reaction Other Reaction: Other reaction Other reaction(s): Other (See Comments) Other Reaction: Other reaction    Past Medical History:  Diagnosis Date  . Cancer ([place])    squamous cell skin ca  . Dementia   . Osteoporosis, senile   . Patella fracture    Left 2014, bilateral 9/15  . Spastic dysphonia     Past Surgical History:  Procedure Laterality Date  . ROTATOR CUFF REPAIR Left   . SKIN CANCER EXCISION     SCC on scalp  . VAGINAL HYSTERECTOMY      Family History  Problem Relation Age of Onset  . Dementia Brother     Social History   Socioeconomic History  . Marital status: Married    Spouse name: Not on file  .  Number of children: 4  . Years of education: Not on file  . Highest education level: Not on file  Occupational History  . 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 Financial resource strain: Not on file  . Food insecurity:    Worry: Not on file    Inability: Not on file  . Transportation needs:    Medical: Not on file    Non-medical: Not on file  Tobacco Use  . Smoking status: Never Smoker  . Smokeless tobacco: Never Used  Substance and Sexual Activity  . Alcohol use: No    Alcohol/week: 0.0 oz  . Drug use: No  . Sexual activity: Never  Lifestyle  . Physical activity:    Days per week: Not on file    Minutes per session: Not on file  . Stress: Not on file  Relationships  . Social connections:    Talks on phone: Not on file    Gets together: Not on file    Attends religious service: Not on file    Active member of club or organization: Not on file    Attends meetings of clubs or organizations: Not on file    Relationship status: Not on file  . Intimate partner violence:    Fear of current or ex partner:  Not on file    Emotionally abused: Not on file    Physically abused: Not on file    Forced sexual activity: Not on file  Other Topics Concern  . Not on file  Social History Narrative   No living will   Requests husband as health care POA   Would accept resuscitation attempts   Not sure about tube feeds   Review of Systems Goes to bed around 10:30 and sleeps well Appetite is fair for lunch and evening meal Husband has to encourage her to drink    Objective:   Physical Exam  Constitutional: No distress.  Musculoskeletal:  Mild tenderness along right paraspinal muscles No spine tenderness Fairly normal back flexion SLR negative Normal ROM in hips  Neurological:  Gait fairly normal Doesn't know month, year, her birthday---does know husband Fairly passive---husband mostly answers for him           Assessment &  Plan:

## 2018-05-08 NOTE — Assessment & Plan Note (Signed)
Still trying to give her boost and ice cream with variable success

## 2018-05-08 NOTE — Assessment & Plan Note (Signed)
Same as always No energy in AM ---but better in afternoon Discussed--likely related to her dementia

## 2018-05-12 DIAGNOSIS — M549 Dorsalgia, unspecified: Secondary | ICD-10-CM | POA: Diagnosis not present

## 2018-05-12 DIAGNOSIS — N200 Calculus of kidney: Secondary | ICD-10-CM | POA: Diagnosis not present

## 2018-05-16 ENCOUNTER — Telehealth: Payer: Self-pay | Admitting: Internal Medicine

## 2018-05-16 NOTE — Telephone Encounter (Signed)
Copied from [place] [phone]. Topic: Quick Communication - See Telephone Encounter >> May 16, 2018 10:37 AM Rutherford Nail, NT wrote: CRM for notification. See Telephone encounter for: 05/16/18. Husband states that on Sunday 05/07/18 patient started having severe pain in lower left back. Saw provider on 05/08/18 for the issue. Friday 05/12/18 had a cat scan- couple small stones in lower left kidney.  "NP who read report did not feel that it was related to this severe pain." Unsure what needs to happen. Taking advil for pain that helps a little bit. CB#:[phone]

## 2018-05-16 NOTE — Telephone Encounter (Signed)
Spoke to pt's husband. Advised him that we have not seen the CT Scan results. He will call to have the report sent to Dr Silvio Pate.

## 2018-05-16 NOTE — Telephone Encounter (Addendum)
Spoke to pt. Husband is away for the moment. Advised her I would call him tomorrow.  I called the number her provided and it is to [facility]. The last CT scan she had was last year. She did have an Xray and a Bladder Scan last week in [facility].

## 2018-05-16 NOTE — Telephone Encounter (Signed)
I can't see any CT scan from Friday. Where did she have it? If there are stones in the kidney, but no kidney enlargement---I don't think that is causing the pain. If the pain is worse, and ibuprofen doesn't help, the next step is probably Dr Lorelei Pont for sports medicine or an orthopedist

## 2018-05-16 NOTE — Telephone Encounter (Addendum)
Patient's husband called again and said he spoke with Dr Denice Bors Cope's office and they had sent the results.   The number over there is [phone]

## 2018-05-17 NOTE — Telephone Encounter (Addendum)
Spoke to pt's husband. He said a CT was done on 05-12-18  And the results are not in the either system.  She seems to be a little better today. Made appt with Dr Lorelei Pont 05-19-18.

## 2018-05-17 NOTE — Telephone Encounter (Signed)
Left message for pt's husband to call back.

## 2018-05-17 NOTE — Telephone Encounter (Signed)
That's what I thought. Stones are not the problem. It may be reasonable to have her see Dr Lorelei Pont to evaluate the back pain

## 2018-05-19 ENCOUNTER — Encounter: Payer: Self-pay | Admitting: Family Medicine

## 2018-05-19 ENCOUNTER — Ambulatory Visit (INDEPENDENT_AMBULATORY_CARE_PROVIDER_SITE_OTHER)
Admission: RE | Admit: 2018-05-19 | Discharge: 2018-05-19 | Disposition: A | Discharge status: Home / Self Care | Payer: Medicare Other | Source: Ambulatory Visit | Attending: Family Medicine | Admitting: Family Medicine

## 2018-05-19 ENCOUNTER — Ambulatory Visit (INDEPENDENT_AMBULATORY_CARE_PROVIDER_SITE_OTHER): Payer: Medicare Other | Admitting: Family Medicine

## 2018-05-19 VITALS — BP 130/78 | HR 100 | Temp 97.7°F | Ht 62.0 in | Wt 75.5 lb

## 2018-05-19 DIAGNOSIS — M545 Low back pain, unspecified: Secondary | ICD-10-CM

## 2018-05-19 DIAGNOSIS — S32010A Wedge compression fracture of first lumbar vertebra, initial encounter for closed fracture: Secondary | ICD-10-CM

## 2018-05-19 DIAGNOSIS — S32020A Wedge compression fracture of second lumbar vertebra, initial encounter for closed fracture: Secondary | ICD-10-CM

## 2018-05-19 NOTE — Progress Notes (Signed)
Dr. Frederico Hamman T. Shadawn Hanaway, MD, [facility] [place], [address] Phone: [phone] Fax: [phone]  05/19/2018  Patient: Kathryn Hodges, MRN: [id number], DOB: [birth year], 84 y.o.  Primary Physician:  Venia Carbon, MD   Chief Complaint  Patient presents with  . Back Pain    Lower Left Back   Subjective:   Kathryn Hodges is a 84 y.o. very pleasant female patient who presents with the following:  84 yo patient with AD who presents with ongoing LL back pain.  The patient is a poor historian, and much of the history is provided by her husband and her daughter.  She intermittently complains of pain in her back centrally as well as on the left lower back and throughout the paraspinous region as well as some in the buttocks.  She has not had any complaints about radicular pain or numbness or tingling in her legs.  Sunday, 6/9 woke with severe pain. Severe most of the time. Taken some advil, helps a little bit.  Living in [place] now.  Melanoma, had some patellar fractures. Bilateral.  Fel down some steps in [facility] and banged up pretty bad.   Past Medical History, Surgical History, Social History, Family History, Problem List, Medications, and Allergies have been reviewed and updated if relevant.  Patient Active Problem List   Diagnosis Date Noted  . Back pain 05/08/2018  . Anterior knee pain 07/05/2017  . Preventative health care 08/16/2016  . Advance directive discussed with patient 08/16/2016  . Malnutrition of moderate degree ([place]) 07/02/2016  . Spastic dysphonia   . Fatigue 03/06/2015  . Alzheimer's dementia   . Chronic cystitis 10/17/2013    Past Medical History:  Diagnosis Date  . Cancer ([place])    squamous cell skin ca  . Dementia   . Osteoporosis, senile   . Patella fracture    Left 2014, bilateral 9/15  . Spastic dysphonia     Past Surgical History:  Procedure Laterality  Date  . ROTATOR CUFF REPAIR Left   . SKIN CANCER EXCISION     SCC on scalp  . VAGINAL HYSTERECTOMY      Social History   Socioeconomic History  . Marital status: Married    Spouse name: Not on file  . Number of children: 4  . Years of education: Not on file  . Highest education level: Not on file  Occupational History  . 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 Financial resource strain: Not on file  . Food insecurity:    Worry: Not on file    Inability: Not on file  . Transportation needs:    Medical: Not on file    Non-medical: Not on file  Tobacco Use  . Smoking status: Never Smoker  . Smokeless tobacco: Never Used  Substance and Sexual Activity  . Alcohol use: No    Alcohol/week: 0.0 oz  . Drug use: No  . Sexual activity: Never  Lifestyle  . Physical activity:    Days per week: Not on file    Minutes per session: Not on file  . Stress: Not on file  Relationships  . Social connections:    Talks on phone: Not on file    Gets together: Not on file    Attends religious service: Not on file    Active member of club or organization: Not on file  Attends meetings of clubs or organizations: Not on file    Relationship status: Not on file  . Intimate partner violence:    Fear of current or ex partner: Not on file    Emotionally abused: Not on file    Physically abused: Not on file    Forced sexual activity: Not on file  Other Topics Concern  . Not on file  Social History Narrative   No living will   Requests husband as health care POA   Would accept resuscitation attempts   Not sure about tube feeds    Family History  Problem Relation Age of Onset  . Dementia Brother     Allergies  Allergen Reactions  . Sulfa Antibiotics Hives, Itching, Rash and Other (See Comments)    Other Reaction: Other reaction Other Reaction: Other reaction Other reaction(s): Other (See Comments) Other Reaction: Other reaction     Medication list reviewed and updated in full in [facility].  GEN: No fevers, chills. Nontoxic. Primarily MSK c/o today. MSK: Detailed in the HPI GI: tolerating PO intake without difficulty Neuro: No numbness, parasthesias, or tingling associated. Otherwise the pertinent positives of the ROS are noted above.   Objective:   BP 130/78   Pulse 100   Temp 97.7 F (36.5 C) (Oral)   Ht 5\' 2"  (1.575 m)   Wt 75 lb 8 oz (34.2 kg)   BMI 13.81 kg/m    GEN: Well-developed,well-nourished,in no acute distress; alert,appropriate and cooperative throughout examination HEENT: Normocephalic and atraumatic without obvious abnormalities. Ears, externally no deformities PULM: Breathing comfortably in no respiratory distress EXT: No clubbing, cyanosis, or edema PSYCH: Normally interactive. Cooperative during the interview. Pleasant. Friendly and conversant. Not anxious or depressed appearing. Normal, full affect.  Range of motion at  the waist: Flexion, extension, lateral bending and rotation: Patient has difficulty with both flexion and extension.  Both of these seem to cause pain.  I do not push further given age and frailty and 75 pounds.  No echymosis or edema Rises to examination table with mild difficulty Gait: minimally antalgic  Inspection/Deformity: N Paraspinus Tenderness: Diffuse pain from L1-S1 bilaterally.  B Ankle Dorsiflexion (L5,4): 5/5 B Great Toe Dorsiflexion (L5,4): 5/5 Rise/Squat (L4): WNL, mild pain  SENSORY B Medial Foot (L4): WNL B Dorsum (L5): WNL B Lateral (S1): WNL Light Touch: WNL Pinprick: WNL  REFLEXES Knee (L4): 2+ Ankle (S1): 2+  B SLR, seated: neg B SLR, supine: neg B FABER: neg B Reverse FABER: neg B Greater Troch: NT B Log Roll: neg B Sciatic Notch: NT   Radiology: Dg Lumbar Spine Complete  Result Date: 05/19/2018 CLINICAL DATA:  acute pain x 2 weeks, eval for compression fx EXAM: LUMBAR SPINE - COMPLETE 4+ VIEW COMPARISON:  CT  abdomen pelvis 08/30/2015 FINDINGS: Normal alignment of the lumbar spine. There is further compression of superior endplate fracture at L2, with approximately with approximately 20% loss of anterior height. There is a new superior endplate fracture of/L1 resulting in 35% loss of anterior height. No suspicious lytic or blastic lesions. No traumatic subluxation. Convex RIGHT scoliosis is present associated with degenerative changes. There is normal bowel gas pattern. IMPRESSION: 1. Interval superior endplate fracture of L1 with loss of anterior height by 35%. 2. Interval further compression of superior endplate fracture of L2 with resulting 20% loss of anterior height. Electronically Signed   By: Nolon Nations M.D.   On: 05/19/2018 11:56     Assessment and Plan:   Compression  fracture of L2 lumbar vertebra, closed, initial encounter ([place])  Acute left-sided low back pain without sciatica - Plan: DG Lumbar Spine Complete  Compression fracture of L1 lumbar vertebra, closed, initial encounter ([place])  >25 minutes spent in face to face time with patient, >50% spent in counselling or coordination of care   All films were independently reviewed by myself and compared with CT independently also.  Patient had a prior CT of the abdomen and pelvis on 2016, where a prior L2 compression fracture is seen.  Compared to the prior CT, there is no evidence of prior L1 compression fracture  on prior CT scan.  There has also been an interval change at L2.  One or both of these likely occurred at the time of acute onset of pain. Exact age would be impossible to determine without MRI or SPECT scan,, but I do not think that that is clinically warranted in this case.  I reviewed conservative measures of the treatment of compression fracture with the patient and her family.  I would avoid opioids in this case given the end-stage and Alzheimer's.  We are going to start with some NSAIDs, Tylenol, as well as lidocaine  patches.  Patient Instructions  Lidocaine Patches Lidocaine cream, 4%  Salonpas is the biggest brand, but there are others.   Alleve 1 tablet twice a day    Follow-up: prn only  Orders Placed This Encounter  Procedures  . DG Lumbar Spine Complete    Signed,  Kynsli Haapala T. Josuel Koeppen, MD   Allergies as of 05/19/2018      Reactions   Sulfa Antibiotics Hives, Itching, Rash, Other (See Comments)   Other Reaction: Other reaction Other Reaction: Other reaction Other reaction(s): Other (See Comments) Other Reaction: Other reaction      Medication List        Accurate as of 05/19/18 11:59 PM. Always use your most recent med list.          ADVIL 200 MG tablet Generic drug:  ibuprofen Take 200 mg by mouth every 4 (four) hours as needed.   FLINTSTONES COMPLETE PO Take 1 tablet by mouth daily.

## 2018-05-19 NOTE — Patient Instructions (Signed)
Lidocaine Patches Lidocaine cream, 4%  Salonpas is the biggest brand, but there are others.   Alleve 1 tablet twice a day

## 2018-06-12 ENCOUNTER — Encounter: Payer: Self-pay | Admitting: Internal Medicine

## 2018-06-12 ENCOUNTER — Ambulatory Visit (INDEPENDENT_AMBULATORY_CARE_PROVIDER_SITE_OTHER): Payer: Medicare Other | Admitting: Internal Medicine

## 2018-06-12 VITALS — BP 118/70 | HR 94 | Temp 98.0°F | Ht 62.0 in | Wt 74.0 lb

## 2018-06-12 DIAGNOSIS — M8000XD Age-related osteoporosis with current pathological fracture, unspecified site, subsequent encounter for fracture with routine healing: Secondary | ICD-10-CM | POA: Diagnosis not present

## 2018-06-12 DIAGNOSIS — M810 Age-related osteoporosis without current pathological fracture: Secondary | ICD-10-CM | POA: Insufficient documentation

## 2018-06-12 DIAGNOSIS — S32020D Wedge compression fracture of second lumbar vertebra, subsequent encounter for fracture with routine healing: Secondary | ICD-10-CM

## 2018-06-12 DIAGNOSIS — G301 Alzheimer's disease with late onset: Secondary | ICD-10-CM | POA: Diagnosis not present

## 2018-06-12 DIAGNOSIS — S32000A Wedge compression fracture of unspecified lumbar vertebra, initial encounter for closed fracture: Secondary | ICD-10-CM | POA: Insufficient documentation

## 2018-06-12 DIAGNOSIS — F0280 Dementia in other diseases classified elsewhere without behavioral disturbance: Secondary | ICD-10-CM | POA: Diagnosis not present

## 2018-06-12 MED ORDER — VITAMIN D (ERGOCALCIFEROL) 1.25 MG (50000 UNIT) PO CAPS: 50000.0000 [IU] | ORAL_CAPSULE | ORAL | 3 refills | Status: DC

## 2018-06-12 MED ORDER — RISEDRONATE SODIUM 150 MG PO TABS: 150.0000 mg | ORAL_TABLET | ORAL | 3 refills | Status: DC

## 2018-06-12 NOTE — Assessment & Plan Note (Signed)
Stable functional status Mild cognitive decline

## 2018-06-12 NOTE — Assessment & Plan Note (Signed)
Pain is better now

## 2018-06-12 NOTE — Progress Notes (Signed)
Subjective:    Patient ID: Kathryn Hodges, female    DOB: 01-13-1931, 84 y.o.   MRN: [id number]  HPI Here with husband for follow up of dementia  Back pain has eased up quite a bit Last 1-2 weeks--the pain is mostly gone No meds for pain now--had been helped by advil and tylenol (no longer taking)  Ongoing memory issues Husband still does all instrumental ADLs except she does wash dishes and makes bed Independent with ADLs Slow cognitive decline Husband will still leave for an hour or 2   Current Outpatient Medications on File Prior to Visit  Medication Sig Dispense Refill  . acetaminophen (TYLENOL) 325 MG tablet Take 650 mg by mouth every 6 (six) hours as needed.    Marland Kitchen ibuprofen (ADVIL) 200 MG tablet Take 200 mg by mouth every 4 (four) hours as needed.    . Multiple Vitamins-Minerals (MULTIVITAMIN ADULT PO) Take 1 tablet by mouth daily.     No current facility-administered medications on file prior to visit.     Allergies  Allergen Reactions  . Sulfa Antibiotics Hives, Itching, Rash and Other (See Comments)    Other Reaction: Other reaction Other Reaction: Other reaction Other reaction(s): Other (See Comments) Other Reaction: Other reaction    Past Medical History:  Diagnosis Date  . Cancer ([place])    squamous cell skin ca  . Dementia   . Osteoporosis, senile   . Patella fracture    Left 2014, bilateral 9/15  . Spastic dysphonia     Past Surgical History:  Procedure Laterality Date  . ROTATOR CUFF REPAIR Left   . SKIN CANCER EXCISION     SCC on scalp  . VAGINAL HYSTERECTOMY      Family History  Problem Relation Age of Onset  . Dementia Brother     Social History   Socioeconomic History  . Marital status: Married    Spouse name: Not on file  . Number of children: 4  . Years of education: Not on file  . Highest education level: Not on file  Occupational History  . 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 Financial resource strain: Not on file  . Food insecurity:    Worry: Not on file    Inability: Not on file  . Transportation needs:    Medical: Not on file    Non-medical: Not on file  Tobacco Use  . Smoking status: Never Smoker  . Smokeless tobacco: Never Used  Substance and Sexual Activity  . Alcohol use: No    Alcohol/week: 0.0 oz  . Drug use: No  . Sexual activity: Never  Lifestyle  . Physical activity:    Days per week: Not on file    Minutes per session: Not on file  . Stress: Not on file  Relationships  . Social connections:    Talks on phone: Not on file    Gets together: Not on file    Attends religious service: Not on file    Active member of club or organization: Not on file    Attends meetings of clubs or organizations: Not on file    Relationship status: Not on file  . Intimate partner violence:    Fear of current or ex partner: Not on file    Emotionally abused: Not on file    Physically abused: Not on file    Forced sexual activity: Not on file  Other Topics Concern  .  Not on file  Social History Narrative   No living will   Requests husband as health care POA   Would accept resuscitation attempts   Not sure about tube feeds   Review of Systems  Never eats great--just fair Lost the 5# she had gained Not much breakfast     Objective:   Physical Exam  Constitutional: No distress.  Still with apparent muscle wasting  Cardiovascular: Normal rate, regular rhythm and normal heart sounds. Exam reveals no gallop.  No murmur heard. Respiratory: Effort normal and breath sounds normal. No respiratory distress. She has no wheezes. She has no rales.  GI: Soft. There is no tenderness.  Musculoskeletal: She exhibits no edema.           Assessment & Plan:

## 2018-06-12 NOTE — Assessment & Plan Note (Signed)
Will go ahead and start bisphosphonate due to high risk

## 2018-06-12 NOTE — Patient Instructions (Addendum)
Please offer [employer] Breakfast with whole milk---if she isn't hungry for breakfast. Take the new medication for bones once a month----on an empty stomach with a full glass of water and stay upright for at least 30 minutes. Take the vitamin D every month also.

## 2018-06-13 ENCOUNTER — Telehealth: Payer: Self-pay

## 2018-06-13 NOTE — Telephone Encounter (Signed)
Pt's husband called and asked about the status of the medication change; pt was informed to reach out to pharmacy as well, contact pt if needed

## 2018-06-13 NOTE — Telephone Encounter (Signed)
Okay to switch to ibandronate 150mg  monthly (#3 x 3)

## 2018-06-13 NOTE — Telephone Encounter (Signed)
Received a fax from the pharmacy stating (risedronate) Actonel is not covered on her insurance but alendronate and ibandronate are covered. Asking to make appropriate change.

## 2018-06-14 MED ORDER — IBANDRONATE SODIUM 150 MG PO TABS: 150.0000 mg | ORAL_TABLET | ORAL | 11 refills | Status: DC

## 2018-06-15 NOTE — Telephone Encounter (Signed)
New medication was sent in

## 2018-07-18 ENCOUNTER — Encounter: Payer: Self-pay | Admitting: Podiatry

## 2018-07-18 ENCOUNTER — Ambulatory Visit (INDEPENDENT_AMBULATORY_CARE_PROVIDER_SITE_OTHER): Payer: Medicare Other | Admitting: Podiatry

## 2018-07-18 DIAGNOSIS — M79676 Pain in unspecified toe(s): Secondary | ICD-10-CM | POA: Diagnosis not present

## 2018-07-18 DIAGNOSIS — B351 Tinea unguium: Secondary | ICD-10-CM | POA: Diagnosis not present

## 2018-07-20 NOTE — Progress Notes (Signed)
   SUBJECTIVE Patient presents to office today complaining of elongated, thickened nails that cause pain while ambulating in shoes. She is unable to trim her own nails. Patient is here for further evaluation and treatment.  Past Medical History:  Diagnosis Date  . Cancer ([place])    squamous cell skin ca  . Dementia   . Osteoporosis, senile   . Patella fracture    Left 2014, bilateral 9/15  . Spastic dysphonia     OBJECTIVE General Patient is awake, alert, and oriented x 3 and in no acute distress. Derm Skin is dry and supple bilateral. Negative open lesions or macerations. Remaining integument unremarkable. Nails are tender, long, thickened and dystrophic with subungual debris, consistent with onychomycosis, 1-5 bilateral. No signs of infection noted. Vasc  DP and PT pedal pulses palpable bilaterally. Temperature gradient within normal limits.  Neuro Epicritic and protective threshold sensation grossly intact bilaterally.  Musculoskeletal Exam No symptomatic pedal deformities noted bilateral. Muscular strength within normal limits.  ASSESSMENT 1. Onychodystrophic nails 1-5 bilateral with hyperkeratosis of nails.  2. Onychomycosis of nail due to dermatophyte bilateral 3. Pain in foot bilateral  PLAN OF CARE 1. Patient evaluated today.  2. Instructed to maintain good pedal hygiene and foot care.  3. Mechanical debridement of nails 1-5 bilaterally performed using a nail nipper. Filed with dremel without incident.  4. Return to clinic in 3 mos.    Edrick Kins, DPM [facility]  Dr. Edrick Kins, [place]                                        [place], [place] [address]                Office [phone]  Fax [phone]

## 2018-07-21 ENCOUNTER — Telehealth: Payer: Self-pay | Admitting: Internal Medicine

## 2018-07-21 MED ORDER — IBANDRONATE SODIUM 150 MG PO TABS: 150.0000 mg | ORAL_TABLET | ORAL | 3 refills | Status: DC

## 2018-07-21 MED ORDER — VITAMIN D (ERGOCALCIFEROL) 1.25 MG (50000 UNIT) PO CAPS: 50000.0000 [IU] | ORAL_CAPSULE | ORAL | 3 refills | Status: DC

## 2018-07-21 NOTE — Telephone Encounter (Signed)
Patient would like the following medications sent to the mail order pharmacy for a 90 day supply.  Boniva and Vitamin D Last refill: Boniva on 06/14/18; Vit D 06/12/18 Last OV:06/12/18 AVW:PVXYIA Pharmacy: [place], [place] [phone] (Phone) [phone] (Fax)

## 2018-07-21 NOTE — Telephone Encounter (Signed)
Copied from [place] [id number]. Topic: Quick Communication - Rx Refill/Question >> Jul 21, 2018  2:41 PM Jarold Motto, [place] Din wrote: Medication: Vitamin D, Ergocalciferol, (DRISDOL) 50000 units CAPS capsule [[id number]] ibandronate (BONIVA) 150 MG tablet [[id number]] pt would like 90 day supply with 3 refills    Has the patient contacted their pharmacy? yes  Preferred Pharmacy (with phone number or street name): [facility], [place] [phone] (Phone) [phone] (Fax)    Agent: Please be advised that RX refills may take up to 3 business days. We ask that you follow-up with your pharmacy.

## 2018-07-21 NOTE — Telephone Encounter (Signed)
Rx sent to [facility]

## 2018-07-24 ENCOUNTER — Ambulatory Visit: Payer: Self-pay

## 2018-07-24 ENCOUNTER — Ambulatory Visit (INDEPENDENT_AMBULATORY_CARE_PROVIDER_SITE_OTHER): Payer: Medicare Other | Admitting: Urology

## 2018-07-24 ENCOUNTER — Encounter: Payer: Self-pay | Admitting: Urology

## 2018-07-24 VITALS — BP 149/73 | HR 102 | Ht 62.0 in | Wt 72.2 lb

## 2018-07-24 DIAGNOSIS — N390 Urinary tract infection, site not specified: Secondary | ICD-10-CM | POA: Diagnosis not present

## 2018-07-24 NOTE — Progress Notes (Signed)
07/24/2018 11:43 AM   Kathryn Hodges Feb 07, 1931 [id number]  Referring provider: Venia Carbon, MD [address] [place], [place] [address]  Chief Complaint  Patient presents with  . Recurrent UTI    HPI: Is followed by Dr. Jacqlyn Larsen for 2 or 3 bladder infections a year.  She has severe dementia.  Her husband is very helpful.  He describes abdominal pain and increased frequency that respond temporarily to antibiotics.  I do not think she is ever tried prophylaxis.  She may have a kidney stone.  She has not had previous bladder surgery.  Modifying factors: There are no other modifying factors  Associated signs and symptoms: There are no other associated signs and symptoms Aggravating and relieving factors: There are no other aggravating or relieving factors Severity: Moderate Duration: Persistent   PMH: Past Medical History:  Diagnosis Date  . Cancer ([place])    squamous cell skin ca  . Dementia   . Osteoporosis, senile   . Patella fracture    Left 2014, bilateral 9/15  . Spastic dysphonia     Surgical History: Past Surgical History:  Procedure Laterality Date  . ROTATOR CUFF REPAIR Left   . SKIN CANCER EXCISION     SCC on scalp  . VAGINAL HYSTERECTOMY      Home Medications:  Allergies as of 07/24/2018      Reactions   Sulfa Antibiotics Hives, Itching, Rash, Other (See Comments)   Other Reaction: Other reaction Other Reaction: Other reaction Other reaction(s): Other (See Comments) Other Reaction: Other reaction      Medication List        Accurate as of 07/24/18 11:43 AM. Always use your most recent med list.          acetaminophen 325 MG tablet Commonly known as:  TYLENOL Take 650 mg by mouth every 6 (six) hours as needed.   ADVIL 200 MG tablet Generic drug:  ibuprofen Take 200 mg by mouth every 4 (four) hours as needed.   ibandronate 150 MG tablet Commonly known as:  BONIVA Take 1 tablet (150 mg total) by mouth every 30 (thirty) days.     MULTIVITAMIN ADULT PO Take 1 tablet by mouth daily.   Vitamin D (Ergocalciferol) 50000 units Caps capsule Commonly known as:  DRISDOL Take 1 capsule (50,000 Units total) by mouth every 30 (thirty) days.       Allergies:  Allergies  Allergen Reactions  . Sulfa Antibiotics Hives, Itching, Rash and Other (See Comments)    Other Reaction: Other reaction Other Reaction: Other reaction Other reaction(s): Other (See Comments) Other Reaction: Other reaction    Family History: Family History  Problem Relation Age of Onset  . Dementia Brother     Social History:  reports that she has never smoked. She has never used smokeless tobacco. She reports that she does not drink alcohol or use drugs.  ROS: UROLOGY Frequent Urination?: No Hard to postpone urination?: No Burning/pain with urination?: No Get up at night to urinate?: No Leakage of urine?: No Urine stream starts and stops?: No Trouble starting stream?: No Do you have to strain to urinate?: No Blood in urine?: No Urinary tract infection?: No Sexually transmitted disease?: No Injury to kidneys or bladder?: No Painful intercourse?: No Weak stream?: No Currently pregnant?: No Vaginal bleeding?: No Last menstrual period?: n  Gastrointestinal Nausea?: No Vomiting?: No Indigestion/heartburn?: No Diarrhea?: No Constipation?: No  Constitutional Fever: No Night sweats?: No Weight loss?: Yes Fatigue?: Yes  Skin Skin rash/lesions?: No Itching?: No  Eyes Blurred vision?: No Double vision?: No  Ears/Nose/Throat Sore throat?: No Sinus problems?: No  Hematologic/Lymphatic Swollen glands?: No Easy bruising?: Yes  Cardiovascular Leg swelling?: No Chest pain?: No  Respiratory Cough?: No Shortness of breath?: No  Endocrine Excessive thirst?: No  Musculoskeletal Back pain?: Yes Joint pain?: No  Neurological Headaches?: No Dizziness?: No  Psychologic Depression?: No Anxiety?: No  Physical  Exam: There were no vitals taken for this visit.  Constitutional:  Alert and oriented, No acute distress. HEENT: [place] AT, moist mucus membranes.  Trachea midline, no masses. Cardiovascular: No clubbing, cyanosis, or edema. Respiratory: Normal respiratory effort, no increased work of breathing. GI: Abdomen is soft, nontender, nondistended, no abdominal masses GU: No CVA tenderness.  No abdominal tenderness Skin: No rashes, bruises or suspicious lesions. Lymph: No cervical or inguinal adenopathy. Neurologic: Grossly intact, no focal deficits, moving all 4 extremities. Psychiatric: Normal mood and affect.  Laboratory Data: Lab Results  Component Value Date   WBC 4.0 04/28/2018   HGB 12.4 04/28/2018   HCT 36.2 04/28/2018   MCV 99.4 04/28/2018   PLT 218 04/28/2018    Lab Results  Component Value Date   CREATININE 0.73 04/28/2018    No results found for: PSA  No results found for: TESTOSTERONE  Lab Results  Component Value Date   HGBA1C 6.1 03/13/2015    Urinalysis    Component Value Date/Time   COLORURINE YELLOW (A) 04/28/2018 1542   APPEARANCEUR CLEAR (A) 04/28/2018 1542   APPEARANCEUR Cloudy 08/11/2014 2013   LABSPEC 1.019 04/28/2018 1542   LABSPEC 1.015 08/11/2014 2013   PHURINE 5.0 04/28/2018 1542   GLUCOSEU NEGATIVE 04/28/2018 1542   GLUCOSEU Negative 08/11/2014 2013   HGBUR NEGATIVE 04/28/2018 [place] 04/28/2018 1542   BILIRUBINUR Negative 08/11/2014 2013   KETONESUR NEGATIVE 04/28/2018 1542   PROTEINUR 30 (A) 04/28/2018 1542   NITRITE NEGATIVE 04/28/2018 1542   [place] 04/28/2018 1542   LEUKOCYTESUR Negative 08/11/2014 2013    Pertinent Imaging: CT scan from 2016 demonstrated distal left ureteral stone.  She had non-nonobstructing stones in left kidney as well.  Assessment & Plan: Patient has recurrent urinary tract infections.  Clinically not infected today.  Last urine culture negative.  She has small nonobstructing  stones left kidney.  I did not repeat her work-up.  I did not catheterize her for stones.  We will see her and treat her as needed for urinary tract infection  1. Recurrent UTI  - Urinalysis, Complete   No follow-ups on file.  Reece Packer, MD  [facility] [address], [place], [place] [address] [phone]

## 2018-07-25 ENCOUNTER — Ambulatory Visit: Payer: Medicare Other | Admitting: Podiatry

## 2018-07-26 DIAGNOSIS — H2513 Age-related nuclear cataract, bilateral: Secondary | ICD-10-CM | POA: Diagnosis not present

## 2018-08-30 ENCOUNTER — Telehealth: Payer: Self-pay | Admitting: Internal Medicine

## 2018-08-30 ENCOUNTER — Telehealth: Payer: Self-pay

## 2018-08-30 NOTE — Telephone Encounter (Signed)
Okay to send Rx for sertraline 25mg  daily (#30 x 1) Set up appt in about 1 month to see how she is doing with it

## 2018-08-30 NOTE — Telephone Encounter (Signed)
Per recent Telephone encounter, we will discuss this ith them tomorrow at her OV

## 2018-08-30 NOTE — Telephone Encounter (Signed)
Pt last seen 06/12/18 for 6 mth f/u. Pt has appt scheduled for 6 mth FU 12/14/18.

## 2018-08-30 NOTE — Telephone Encounter (Signed)
Copied from [place] [id number]. Topic: Quick Communication - See Telephone Encounter >> Aug 30, 2018 12:12 PM Gardiner Ramus wrote: CRM for notification. See Telephone encounter for: 08/30/18. Becky calling from [facility] called and stated that patient has had some dizzyness and has been light headed. Jacqlyn Larsen is an Therapist, sports and is with the patient now. Rn believes she is dehydrated  Pt would like to schedule with Dr Silvio Pate.  Jacqlyn Larsen Cb# [phone]

## 2018-08-30 NOTE — Telephone Encounter (Signed)
Spoke to pt's husband (No DPR on file but pt gave permission to speak with him) and scheduled for 10/3 @ 1630. Pt states they live at [facility] and wants to know if Dr Silvio Pate will see her there, tomorrow, or if he is needed to bring her to the appt tomorrow. I advised him La[place] would contact him back today to let him know. pls advise

## 2018-08-30 NOTE — Telephone Encounter (Signed)
Will discuss the mood issues with him then also

## 2018-08-30 NOTE — Telephone Encounter (Signed)
Called to advise the husband about seeing her here at the office tomorrow and the pt answered. She is aware she has an appt at 430. I will follow-up with him tomorrow just to make sure.

## 2018-08-30 NOTE — Telephone Encounter (Signed)
Copied from [place] [phone]. Topic: Appointment Scheduling - Scheduling Inquiry for Clinic >> Aug 30, 2018  8:16 AM [place], Lanice Schwab wrote: Mr. Sampey called in to make flu shot appointments for he and Elzie.  He states that Bobbiejo is crying several times a day and he has confirmed it is not related to pain.  She is not interested in activities.  Mr. Ouk states that Dr. Silvio Pate is familiar with their case and he is inquiring if Mrs. Ficek can be put on a mild anti-depressant as her symptoms have worsened.  He will bring her for an appointment but wanted a note sent to Dr. Silvio Pate prior to scheduling an appointment.

## 2018-08-31 ENCOUNTER — Encounter: Payer: Self-pay | Admitting: Internal Medicine

## 2018-08-31 ENCOUNTER — Ambulatory Visit (INDEPENDENT_AMBULATORY_CARE_PROVIDER_SITE_OTHER): Payer: Medicare Other | Admitting: Internal Medicine

## 2018-08-31 VITALS — BP 122/80 | HR 91 | Temp 98.2°F | Ht 62.0 in | Wt 72.0 lb

## 2018-08-31 DIAGNOSIS — G301 Alzheimer's disease with late onset: Secondary | ICD-10-CM

## 2018-08-31 DIAGNOSIS — F39 Unspecified mood [affective] disorder: Secondary | ICD-10-CM

## 2018-08-31 DIAGNOSIS — Z23 Encounter for immunization: Secondary | ICD-10-CM | POA: Diagnosis not present

## 2018-08-31 DIAGNOSIS — R42 Dizziness and giddiness: Secondary | ICD-10-CM | POA: Insufficient documentation

## 2018-08-31 DIAGNOSIS — F0280 Dementia in other diseases classified elsewhere without behavioral disturbance: Secondary | ICD-10-CM

## 2018-08-31 NOTE — Telephone Encounter (Signed)
Spoke to pt's husband.

## 2018-08-31 NOTE — Assessment & Plan Note (Signed)
Yesterday but better today ?dehydration  Discussed encouraging fluids No action needed now

## 2018-08-31 NOTE — Progress Notes (Signed)
Subjective:    Patient ID: SHARESA KEMP, female    DOB: 11/05/1931, 84 y.o.   MRN: [id number]  HPI Here with husband due to dizziness and mood issues  Yesterday, almost fell ---husband had to catch her In bathroom walking by---husband saw her in mirror and got to her in time Then dizziness persisted for several hours Becky RN came and evaluated her and felt she should be seen  No chest pain Only dizzy if up No edema No apparent palpitations  Better today Eating about the same---"fair" Not drinking much though  Now sleeping more Tears up 2-3 times a day Appears to be depressed per husband No apparent pain issues Only watches a little TV, has music but doesn't listen Will look at simple word books---will just look through Not getting out much socially---resists when husband tries to get her out  Does really seem to be sad at times (not just the effects of the dementia)  Current Outpatient Medications on File Prior to Visit  Medication Sig Dispense Refill  . ibandronate (BONIVA) 150 MG tablet Take 1 tablet (150 mg total) by mouth every 30 (thirty) days. 3 tablet 3  . Multiple Vitamins-Minerals (MULTIVITAMIN ADULT PO) Take 1 tablet by mouth daily.    . Vitamin D, Ergocalciferol, (DRISDOL) 50000 units CAPS capsule Take 1 capsule (50,000 Units total) by mouth every 30 (thirty) days. 3 capsule 3   No current facility-administered medications on file prior to visit.     Allergies  Allergen Reactions  . Sulfa Antibiotics Hives, Itching, Rash and Other (See Comments)    Other Reaction: Other reaction Other Reaction: Other reaction Other reaction(s): Other (See Comments) Other Reaction: Other reaction    Past Medical History:  Diagnosis Date  . Cancer ([place])    squamous cell skin ca  . Dementia ([place])   . Osteoporosis, senile   . Patella fracture    Left 2014, bilateral 9/15  . Spastic dysphonia     Past Surgical History:  Procedure Laterality Date  . ROTATOR  CUFF REPAIR Left   . SKIN CANCER EXCISION     SCC on scalp  . VAGINAL HYSTERECTOMY      Family History  Problem Relation Age of Onset  . Dementia Brother     Social History   Socioeconomic History  . Marital status: Married    Spouse name: Not on file  . Number of children: 4  . Years of education: Not on file  . Highest education level: Not on file  Occupational History  . 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 Financial resource strain: Not on file  . Food insecurity:    Worry: Not on file    Inability: Not on file  . Transportation needs:    Medical: Not on file    Non-medical: Not on file  Tobacco Use  . Smoking status: Never Smoker  . Smokeless tobacco: Never Used  Substance and Sexual Activity  . Alcohol use: No    Alcohol/week: 0.0 standard drinks  . Drug use: No  . Sexual activity: Never  Lifestyle  . Physical activity:    Days per week: Not on file    Minutes per session: Not on file  . Stress: Not on file  Relationships  . Social connections:    Talks on phone: Not on file    Gets together: Not on file    Attends religious service: Not on file  Active member of club or organization: Not on file    Attends meetings of clubs or organizations: Not on file    Relationship status: Not on file  . Intimate partner violence:    Fear of current or ex partner: Not on file    Emotionally abused: Not on file    Physically abused: Not on file    Forced sexual activity: Not on file  Other Topics Concern  . Not on file  Social History Narrative   No living will   Requests husband as health care POA   Would accept resuscitation attempts   Not sure about tube feeds   Review of Systems No constipation---still uses bathroom alone No urinary problems No skin lesions or concern for infection No cough or fever    Objective:   Physical Exam  Constitutional:  Wasting as usual  Neck: No thyromegaly present.    Cardiovascular: Normal rate, regular rhythm and normal heart sounds. Exam reveals no gallop.  No murmur heard. Respiratory: Effort normal and breath sounds normal. No respiratory distress. She has no wheezes. She has no rales.  GI: Soft. There is no tenderness.  Musculoskeletal: She exhibits no edema.  Lymphadenopathy:    She has no cervical adenopathy.  Psychiatric:  Very passive Mumbled 1 word answers at most           Assessment & Plan:

## 2018-08-31 NOTE — Assessment & Plan Note (Signed)
Clear functional worsening Husband doing okay for now--but may need to consider getting help soon

## 2018-08-31 NOTE — Assessment & Plan Note (Signed)
Crying daily Becoming more socially isolated Some anhedonia  Likely that this is related to her dementia Discussed medication options ---would consider SSRI like low dose sertraline (but does increase fall risk which could be a significant issue) memantine may help mood--could consider this Discussed [place] [facility]---day program. This may be the best alternative and husband will look into it

## 2018-09-01 ENCOUNTER — Telehealth: Payer: Self-pay | Admitting: Internal Medicine

## 2018-09-01 NOTE — Telephone Encounter (Signed)
Pt's spouse dropped off forms to be filled out for [facility]. Placed in [place] tower.

## 2018-09-05 NOTE — Telephone Encounter (Signed)
Forms placed in Dr Alla German Inbox on his desk

## 2018-09-06 NOTE — Telephone Encounter (Signed)
Spoke to Kathryn Hodges. She said there is a nurse who can read a PPD if we place it.  Pt's husband is not home right now. I will try him again later. Forms up front. She needs to schedule a PPD placement.

## 2018-09-06 NOTE — Telephone Encounter (Signed)
Form done No charge Probably needs to come in for PPD---find out from [facility] for sure (and see if someone there can read it if we place it)

## 2018-09-06 NOTE — Telephone Encounter (Signed)
Called and left a message for Santiago Glad with [place] at [facility]. Waiting for her response.

## 2018-09-07 ENCOUNTER — Ambulatory Visit: Payer: Medicare Other

## 2018-09-07 NOTE — Telephone Encounter (Signed)
Spoke to pt's husband. He said he would get with Santiago Glad. He thought she did not need the TB skin test. He will come get the forms tomorrow.

## 2018-09-07 NOTE — Telephone Encounter (Signed)
Tried to call husband, no answer

## 2018-09-08 ENCOUNTER — Telehealth: Payer: Self-pay | Admitting: Internal Medicine

## 2018-09-08 NOTE — Telephone Encounter (Signed)
Pt needs TB test for [facility]. Next open nurse appt isn't until first week of November. Are we able to get her in sooner to accommodate?

## 2018-09-11 NOTE — Telephone Encounter (Signed)
Kathryn Hodges,  If you will add her on Tues or Wednesday afternoon on nurse schedule, Larene Beach is on assist and probably will be happy to do that for her.

## 2018-09-14 NOTE — Telephone Encounter (Signed)
appt scheduled 10-04-18

## 2018-09-19 ENCOUNTER — Ambulatory Visit: Payer: Medicare Other | Admitting: Podiatry

## 2018-09-22 ENCOUNTER — Ambulatory Visit: Payer: Medicare Other | Admitting: Podiatry

## 2018-09-29 ENCOUNTER — Encounter: Payer: Self-pay | Admitting: Podiatry

## 2018-09-29 ENCOUNTER — Ambulatory Visit (INDEPENDENT_AMBULATORY_CARE_PROVIDER_SITE_OTHER): Payer: Medicare Other | Admitting: Podiatry

## 2018-09-29 DIAGNOSIS — B351 Tinea unguium: Secondary | ICD-10-CM

## 2018-09-29 DIAGNOSIS — M79676 Pain in unspecified toe(s): Secondary | ICD-10-CM | POA: Diagnosis not present

## 2018-10-02 NOTE — Progress Notes (Signed)
   SUBJECTIVE Patient presents to office today complaining of elongated, thickened nails that cause pain while ambulating in shoes. She is unable to trim her own nails. Patient is here for further evaluation and treatment.  Past Medical History:  Diagnosis Date  . Cancer ([facility])    squamous cell skin ca  . Dementia ([facility])   . Osteoporosis, senile   . Patella fracture    Left 2014, bilateral 9/15  . Spastic dysphonia     OBJECTIVE General Patient is awake, alert, and oriented x 3 and in no acute distress. Derm Skin is dry and supple bilateral. Negative open lesions or macerations. Remaining integument unremarkable. Nails are tender, long, thickened and dystrophic with subungual debris, consistent with onychomycosis, 1-5 bilateral. No signs of infection noted. Vasc  DP and PT pedal pulses palpable bilaterally. Temperature gradient within normal limits.  Neuro Epicritic and protective threshold sensation grossly intact bilaterally.  Musculoskeletal Exam No symptomatic pedal deformities noted bilateral. Muscular strength within normal limits.  ASSESSMENT 1. Onychodystrophic nails 1-5 bilateral with hyperkeratosis of nails.  2. Onychomycosis of nail due to dermatophyte bilateral 3. Pain in foot bilateral  PLAN OF CARE 1. Patient evaluated today.  2. Instructed to maintain good pedal hygiene and foot care.  3. Mechanical debridement of nails 1-5 bilaterally performed using a nail nipper. Filed with dremel without incident.  4. Return to clinic in 3 mos.     M. , DPM [facility]  Dr.  M. , DPM    [address]                                        , [place] [address]                Office [phone]  Fax [phone]     

## 2018-10-04 ENCOUNTER — Ambulatory Visit (INDEPENDENT_AMBULATORY_CARE_PROVIDER_SITE_OTHER): Payer: Medicare Other | Admitting: *Deleted

## 2018-10-04 DIAGNOSIS — Z111 Encounter for screening for respiratory tuberculosis: Secondary | ICD-10-CM | POA: Diagnosis not present

## 2018-10-04 NOTE — Progress Notes (Signed)
Per orders of Dr. Silvio Pate injection of PPD given by Lauralyn Primes. Patient tolerated injection well.

## 2018-10-19 DIAGNOSIS — D1801 Hemangioma of skin and subcutaneous tissue: Secondary | ICD-10-CM | POA: Diagnosis not present

## 2018-10-19 DIAGNOSIS — L821 Other seborrheic keratosis: Secondary | ICD-10-CM | POA: Diagnosis not present

## 2018-10-19 DIAGNOSIS — Z85828 Personal history of other malignant neoplasm of skin: Secondary | ICD-10-CM | POA: Diagnosis not present

## 2018-10-19 DIAGNOSIS — D692 Other nonthrombocytopenic purpura: Secondary | ICD-10-CM | POA: Diagnosis not present

## 2018-10-19 DIAGNOSIS — L720 Epidermal cyst: Secondary | ICD-10-CM | POA: Diagnosis not present

## 2018-10-30 ENCOUNTER — Telehealth: Payer: Self-pay | Admitting: Internal Medicine

## 2018-10-30 MED ORDER — MEMANTINE HCL 5 MG PO TABS: 5.0000 mg | ORAL_TABLET | Freq: Two times a day (BID) | ORAL | 3 refills | Status: DC

## 2018-10-30 NOTE — Telephone Encounter (Signed)
Spouse (Phillip)called wanting to know if you could call pt in something for her depression.  He said he spoke with you about this.  He stated she went to adult day care for 1 month he stated when she went   She only wanted to stay 1/2 day.  Sleeping  A lot and cries some during day.  She is withdrawing some.  Does want to go out.   Best number [phone]  [facility] [place].

## 2018-10-30 NOTE — Telephone Encounter (Signed)
Spoke to pt's husband per DPR. He will call back to schedule her appt.

## 2018-10-30 NOTE — Telephone Encounter (Signed)
If something is called please let Doren Custard know

## 2018-10-30 NOTE — Telephone Encounter (Signed)
Please let him know that I sent a prescription for the safer of the 2 medications we discussed.  Have him let me know if any problems (it is bid) Schedule follow up in 3-4 weeks

## 2018-11-30 ENCOUNTER — Ambulatory Visit (INDEPENDENT_AMBULATORY_CARE_PROVIDER_SITE_OTHER): Payer: Medicare Other | Admitting: Internal Medicine

## 2018-11-30 ENCOUNTER — Encounter: Payer: Self-pay | Admitting: Internal Medicine

## 2018-11-30 VITALS — BP 118/80 | HR 83 | Temp 97.6°F | Ht 62.0 in | Wt 75.0 lb

## 2018-11-30 DIAGNOSIS — F0280 Dementia in other diseases classified elsewhere without behavioral disturbance: Secondary | ICD-10-CM

## 2018-11-30 DIAGNOSIS — G301 Alzheimer's disease with late onset: Secondary | ICD-10-CM | POA: Diagnosis not present

## 2018-11-30 DIAGNOSIS — F39 Unspecified mood [affective] disorder: Secondary | ICD-10-CM | POA: Diagnosis not present

## 2018-11-30 MED ORDER — MEMANTINE HCL 10 MG PO TABS: 10.0000 mg | ORAL_TABLET | Freq: Two times a day (BID) | ORAL | 5 refills | Status: DC

## 2018-11-30 NOTE — Assessment & Plan Note (Signed)
Didn't do well in adult day care setting Husband does ongoing care No hired help as yet (except if husband has to be away for extended time). He has been able to leave her for errands, etc

## 2018-11-30 NOTE — Progress Notes (Signed)
Subjective:    Patient ID: Kathryn Hodges, female    DOB: [birth year], 85 y.o.   MRN: [id number]  HPI Here with husband for follow up of mood issues memantine--low dose--started 1 month ago  [place] [facility] for about a month She "just didn't want to go" per husband Then he called and that is when we tried the medication  Husband is not sure if she is any better on this medication It may have helped some Still has "blue spells"---usually every morning for 15 or so minutes (tearful)  Current Outpatient Medications on File Prior to Visit  Medication Sig Dispense Refill  . ibandronate (BONIVA) 150 MG tablet Take 1 tablet (150 mg total) by mouth every 30 (thirty) days. 3 tablet 3  . memantine (NAMENDA) 5 MG tablet Take 1 tablet (5 mg total) by mouth 2 (two) times daily. 60 tablet 3  . Multiple Vitamins-Minerals (MULTIVITAMIN ADULT PO) Take 1 tablet by mouth daily.    . Vitamin D, Ergocalciferol, (DRISDOL) 50000 units CAPS capsule Take 1 capsule (50,000 Units total) by mouth every 30 (thirty) days. 3 capsule 3   No current facility-administered medications on file prior to visit.     Allergies  Allergen Reactions  . Sulfa Antibiotics Hives, Itching, Rash and Other (See Comments)    Other Reaction: Other reaction Other Reaction: Other reaction Other reaction(s): Other (See Comments) Other Reaction: Other reaction    Past Medical History:  Diagnosis Date  . Cancer ([place])    squamous cell skin ca  . Dementia ([place])   . Osteoporosis, senile   . Patella fracture    Left 2014, bilateral 9/15  . Spastic dysphonia     Past Surgical History:  Procedure Laterality Date  . ROTATOR CUFF REPAIR Left   . SKIN CANCER EXCISION     SCC on scalp  . VAGINAL HYSTERECTOMY      Family History  Problem Relation Age of Onset  . Dementia Brother     Social History   Socioeconomic History  . Marital status: Married    Spouse name: Not on file  . Number of children: 4  . Years  of education: Not on file  . Highest education level: Not on file  Occupational History  . 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 Financial resource strain: Not on file  . Food insecurity:    Worry: Not on file    Inability: Not on file  . Transportation needs:    Medical: Not on file    Non-medical: Not on file  Tobacco Use  . Smoking status: Never Smoker  . Smokeless tobacco: Never Used  Substance and Sexual Activity  . Alcohol use: No    Alcohol/week: 0.0 standard drinks  . Drug use: No  . Sexual activity: Never  Lifestyle  . Physical activity:    Days per week: Not on file    Minutes per session: Not on file  . Stress: Not on file  Relationships  . Social connections:    Talks on phone: Not on file    Gets together: Not on file    Attends religious service: Not on file    Active member of club or organization: Not on file    Attends meetings of clubs or organizations: Not on file    Relationship status: Not on file  . Intimate partner violence:    Fear of current or ex partner: Not on  file    Emotionally abused: Not on file    Physically abused: Not on file    Forced sexual activity: Not on file  Other Topics Concern  . Not on file  Social History Narrative   No living will   Requests husband as health care POA   Would accept resuscitation attempts   Not sure about tube feeds   Review of Systems Sleeps well--likes to get up late in the morning Appetite is okay---weight back up a few pounds Taking an evening snack---rich ice cream    Objective:   Physical Exam  Constitutional: No distress.  Neurological:  Some psychoactive retardation Brief speaking in answer to questions  Not clearly depressed           Assessment & Plan:

## 2018-11-30 NOTE — Assessment & Plan Note (Signed)
Still has daily AM crying spell No MDD Will try increasing the memantine to see if that helps

## 2018-12-14 ENCOUNTER — Ambulatory Visit: Payer: Medicare Other | Admitting: Internal Medicine

## 2018-12-25 ENCOUNTER — Telehealth: Payer: Self-pay | Admitting: Internal Medicine

## 2018-12-25 NOTE — Telephone Encounter (Signed)
Pt's spouse is calling and want call back from nurse to discuss some concerning. He stated the pt seem like she will fall and wobbles. The medication  Memantine has helped with her memory some.

## 2018-12-25 NOTE — Telephone Encounter (Signed)
Tried to Call pt's husband but there was no answer.

## 2018-12-26 ENCOUNTER — Ambulatory Visit (INDEPENDENT_AMBULATORY_CARE_PROVIDER_SITE_OTHER): Payer: Medicare Other | Admitting: Podiatry

## 2018-12-26 DIAGNOSIS — M79676 Pain in unspecified toe(s): Secondary | ICD-10-CM | POA: Diagnosis not present

## 2018-12-26 DIAGNOSIS — B351 Tinea unguium: Secondary | ICD-10-CM

## 2019-01-02 ENCOUNTER — Encounter: Payer: Self-pay | Admitting: Internal Medicine

## 2019-01-02 ENCOUNTER — Ambulatory Visit (INDEPENDENT_AMBULATORY_CARE_PROVIDER_SITE_OTHER): Payer: Medicare Other | Admitting: Internal Medicine

## 2019-01-02 VITALS — BP 136/80 | HR 94 | Temp 97.6°F | Ht 62.0 in | Wt 77.5 lb

## 2019-01-02 DIAGNOSIS — F0280 Dementia in other diseases classified elsewhere without behavioral disturbance: Secondary | ICD-10-CM | POA: Diagnosis not present

## 2019-01-02 DIAGNOSIS — G301 Alzheimer's disease with late onset: Secondary | ICD-10-CM | POA: Diagnosis not present

## 2019-01-02 DIAGNOSIS — E440 Moderate protein-calorie malnutrition: Secondary | ICD-10-CM

## 2019-01-02 NOTE — Telephone Encounter (Signed)
Pt was seen in office today

## 2019-01-02 NOTE — Patient Instructions (Signed)
Stop the memantine now. If she worsens considerably, you can try her back on the lower dose (5mg  twice a day)

## 2019-01-02 NOTE — Assessment & Plan Note (Signed)
Has gained 2#--husband waking her early for breakfast and then letting her go back to bed Not clear if the memantine is part of why she has improved (but we are stopping this due to increased instability walking)

## 2019-01-02 NOTE — Progress Notes (Signed)
Subjective:    Patient ID: Kathryn Hodges, female    DOB: 1931-11-17, 85 y.o.   MRN: [id number]  HPI Here with husband for follow up of dementia and mood problems  Is eating slightly better Weight is up 2#  Medication has helped mental state--- some cognitive improvement He has noticed a decline in her balance Close to falling on a couple of occasions  Crying spells are about the same Still twice a day Mostly just sits on the couch or on bed Some visitors---seems to enjoy that TV can be on--but not clear she attends to it much  Current Outpatient Medications on File Prior to Visit  Medication Sig Dispense Refill  . ibandronate (BONIVA) 150 MG tablet Take 1 tablet (150 mg total) by mouth every 30 (thirty) days. 3 tablet 3  . memantine (NAMENDA) 10 MG tablet Take 1 tablet (10 mg total) by mouth 2 (two) times daily. 60 tablet 5  . Multiple Vitamins-Minerals (MULTIVITAMIN ADULT PO) Take 1 tablet by mouth daily.    . Vitamin D, Ergocalciferol, (DRISDOL) 50000 units CAPS capsule Take 1 capsule (50,000 Units total) by mouth every 30 (thirty) days. 3 capsule 3   No current facility-administered medications on file prior to visit.     Allergies  Allergen Reactions  . Sulfa Antibiotics Hives, Itching, Rash and Other (See Comments)    Other Reaction: Other reaction Other Reaction: Other reaction Other reaction(s): Other (See Comments) Other Reaction: Other reaction    Past Medical History:  Diagnosis Date  . Cancer ([place])    squamous cell skin ca  . Dementia ([place])   . Osteoporosis, senile   . Patella fracture    Left 2014, bilateral 9/15  . Spastic dysphonia     Past Surgical History:  Procedure Laterality Date  . ROTATOR CUFF REPAIR Left   . SKIN CANCER EXCISION     SCC on scalp  . VAGINAL HYSTERECTOMY      Family History  Problem Relation Age of Onset  . Dementia Brother     Social History   Socioeconomic History  . Marital status: Married    Spouse name:  Not on file  . Number of children: 4  . Years of education: Not on file  . Highest education level: Not on file  Occupational History  . 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 Financial resource strain: Not on file  . Food insecurity:    Worry: Not on file    Inability: Not on file  . Transportation needs:    Medical: Not on file    Non-medical: Not on file  Tobacco Use  . Smoking status: Never Smoker  . Smokeless tobacco: Never Used  Substance and Sexual Activity  . Alcohol use: No    Alcohol/week: 0.0 standard drinks  . Drug use: No  . Sexual activity: Never  Lifestyle  . Physical activity:    Days per week: Not on file    Minutes per session: Not on file  . Stress: Not on file  Relationships  . Social connections:    Talks on phone: Not on file    Gets together: Not on file    Attends religious service: Not on file    Active member of club or organization: Not on file    Attends meetings of clubs or organizations: Not on file    Relationship status: Not on file  . Intimate partner violence:  Fear of current or ex partner: Not on file    Emotionally abused: Not on file    Physically abused: Not on file    Forced sexual activity: Not on file  Other Topics Concern  . Not on file  Social History Narrative   No living will   Requests husband as health care POA   Would accept resuscitation attempts   Not sure about tube feeds   Review of Systems  Sleeps fine---a lot night and day Bowels seem to be okay--still does this on her own     Objective:   Physical Exam  Constitutional: No distress.  Neurological:  Independent with transfers in and out of chair Gait is slow but steady           Assessment & Plan:

## 2019-01-02 NOTE — Progress Notes (Signed)
   SUBJECTIVE Patient presents to office today complaining of elongated, thickened nails that cause pain while ambulating in shoes. She is unable to trim her own nails. Patient is here for further evaluation and treatment.  Past Medical History:  Diagnosis Date  . Cancer ([place])    squamous cell skin ca  . Dementia ([place])   . Osteoporosis, senile   . Patella fracture    Left 2014, bilateral 9/15  . Spastic dysphonia     OBJECTIVE General Patient is awake, alert, and oriented x 3 and in no acute distress. Derm Skin is dry and supple bilateral. Negative open lesions or macerations. Remaining integument unremarkable. Nails are tender, long, thickened and dystrophic with subungual debris, consistent with onychomycosis, 1-5 bilateral. No signs of infection noted. Vasc  DP and PT pedal pulses palpable bilaterally. Temperature gradient within normal limits.  Neuro Epicritic and protective threshold sensation grossly intact bilaterally.  Musculoskeletal Exam No symptomatic pedal deformities noted bilateral. Muscular strength within normal limits.  ASSESSMENT 1. Onychodystrophic nails 1-5 bilateral with hyperkeratosis of nails.  2. Onychomycosis of nail due to dermatophyte bilateral 3. Pain in foot bilateral  PLAN OF CARE 1. Patient evaluated today.  2. Instructed to maintain good pedal hygiene and foot care.  3. Mechanical debridement of nails 1-5 bilaterally performed using a nail nipper. Filed with dremel without incident.  4. Return to clinic in 3 mos.    Edrick Kins, DPM [facility]  Dr. Edrick Kins, [address]                                        [place], [place] [address]                Office [phone]  Fax [phone]

## 2019-01-02 NOTE — Assessment & Plan Note (Signed)
Slightly better cognitively on the memantine---but with clear instability, risks are not worth it Will stop the memantine Consider restarting at 5mg  bid if marked decline

## 2019-03-01 ENCOUNTER — Ambulatory Visit: Payer: Medicare Other | Admitting: Internal Medicine

## 2019-03-27 ENCOUNTER — Ambulatory Visit (INDEPENDENT_AMBULATORY_CARE_PROVIDER_SITE_OTHER): Payer: Medicare Other | Admitting: Podiatry

## 2019-03-27 ENCOUNTER — Other Ambulatory Visit: Payer: Self-pay

## 2019-03-27 ENCOUNTER — Encounter: Payer: Self-pay | Admitting: Podiatry

## 2019-03-27 VITALS — Temp 97.6°F

## 2019-03-27 DIAGNOSIS — L989 Disorder of the skin and subcutaneous tissue, unspecified: Secondary | ICD-10-CM | POA: Diagnosis not present

## 2019-03-27 DIAGNOSIS — B351 Tinea unguium: Secondary | ICD-10-CM | POA: Diagnosis not present

## 2019-03-27 DIAGNOSIS — M79676 Pain in unspecified toe(s): Secondary | ICD-10-CM

## 2019-03-27 NOTE — Progress Notes (Signed)
   SUBJECTIVE Patient presents to office today complaining of elongated, thickened nails that cause pain while ambulating in shoes. She is unable to trim her own nails. Patient is here for further evaluation and treatment.  Past Medical History:  Diagnosis Date  . Cancer ([place])    squamous cell skin ca  . Dementia ([place])   . Osteoporosis, senile   . Patella fracture    Left 2014, bilateral 9/15  . Spastic dysphonia     OBJECTIVE General Patient is awake, alert, and oriented x 3 and in no acute distress. Derm Skin is dry and supple bilateral. Negative open lesions or macerations. Remaining integument unremarkable. Nails are tender, long, thickened and dystrophic with subungual debris, consistent with onychomycosis, 1-5 bilateral. No signs of infection noted.  There is some hyperkeratotic callus tissue noted to the bilateral feet x2 Vasc  DP and PT pedal pulses palpable bilaterally. Temperature gradient within normal limits.  Neuro Epicritic and protective threshold sensation grossly intact bilaterally.  Musculoskeletal Exam No symptomatic pedal deformities noted bilateral. Muscular strength within normal limits.  ASSESSMENT 1. Onychodystrophic nails 1-5 bilateral with hyperkeratosis of nails.  2. Onychomycosis of nail due to dermatophyte bilateral 3. Pain in foot bilateral  PLAN OF CARE 1. Patient evaluated today.  2. Instructed to maintain good pedal hygiene and foot care.  3. Mechanical debridement of nails 1-5 bilaterally performed using a nail nipper. Filed with dremel without incident.  4.  Excisional debridement of the hyperkeratotic callus lesions noted was performed using a tissue nipper without incident or bleeding.   5.  Return to clinic in 3 mos.    Edrick Kins, DPM [facility]  Dr. Edrick Kins, [address]                                        [place], [place] [address]                Office [phone]  Fax [phone]

## 2019-04-05 ENCOUNTER — Ambulatory Visit: Payer: Medicare Other | Admitting: Internal Medicine

## 2019-04-05 ENCOUNTER — Telehealth: Payer: Self-pay | Admitting: Internal Medicine

## 2019-04-05 NOTE — Telephone Encounter (Signed)
I left a detailed message on patient's voice mail to change appointment to phone call.

## 2019-04-12 ENCOUNTER — Ambulatory Visit: Payer: Medicare Other | Admitting: Internal Medicine

## 2019-05-17 ENCOUNTER — Encounter: Payer: Self-pay | Admitting: Internal Medicine

## 2019-05-17 ENCOUNTER — Ambulatory Visit (INDEPENDENT_AMBULATORY_CARE_PROVIDER_SITE_OTHER): Payer: Medicare Other | Admitting: Internal Medicine

## 2019-05-17 DIAGNOSIS — F39 Unspecified mood [affective] disorder: Secondary | ICD-10-CM | POA: Diagnosis not present

## 2019-05-17 DIAGNOSIS — F0280 Dementia in other diseases classified elsewhere without behavioral disturbance: Secondary | ICD-10-CM | POA: Diagnosis not present

## 2019-05-17 DIAGNOSIS — M810 Age-related osteoporosis without current pathological fracture: Secondary | ICD-10-CM | POA: Diagnosis not present

## 2019-05-17 DIAGNOSIS — E440 Moderate protein-calorie malnutrition: Secondary | ICD-10-CM | POA: Diagnosis not present

## 2019-05-17 DIAGNOSIS — G308 Other Alzheimer's disease: Secondary | ICD-10-CM

## 2019-05-17 NOTE — Assessment & Plan Note (Signed)
Approaching moderate stage Husband is doing okay so far but some care giver stress Suggested he increase his aide time

## 2019-05-17 NOTE — Assessment & Plan Note (Signed)
Periodic sadness Not MDD Not bad enough to try other meds---especially with the risks involved

## 2019-05-17 NOTE — Progress Notes (Signed)
Subjective:    Patient ID: Kathryn Hodges, female    DOB: 04-28-1931, 85 y.o.   MRN: [id number]  HPI Visit with husband for follow up of worsening Alzheimer's dementia  She continues to deteriorate per husband Sleeps a lot of the time Thinking a fork is a straw, mood issues Walks holding onto husband. Has walker but generally doesn't use it Still uses the bathroom and is continent Lots of help with showers and dressing CNA once a week for good bath/hair  Will get teary and "blue" at times Usually transient Occasionally agitated or upset--but mostly mixed up and sad  Current Outpatient Medications on File Prior to Visit  Medication Sig Dispense Refill  . ibandronate (BONIVA) 150 MG tablet Take 1 tablet (150 mg total) by mouth every 30 (thirty) days. 3 tablet 3  . Multiple Vitamins-Minerals (MULTIVITAMIN ADULT PO) Take 1 tablet by mouth daily.    . Vitamin D, Ergocalciferol, (DRISDOL) 50000 units CAPS capsule Take 1 capsule (50,000 Units total) by mouth every 30 (thirty) days. 3 capsule 3   No current facility-administered medications on file prior to visit.     Allergies  Allergen Reactions  . Sulfa Antibiotics Hives, Itching, Rash and Other (See Comments)    Other Reaction: Other reaction Other Reaction: Other reaction Other reaction(s): Other (See Comments) Other Reaction: Other reaction    Past Medical History:  Diagnosis Date  . Cancer ([place])    squamous cell skin ca  . Dementia ([place])   . Osteoporosis, senile   . Patella fracture    Left 2014, bilateral 9/15  . Spastic dysphonia     Past Surgical History:  Procedure Laterality Date  . ROTATOR CUFF REPAIR Left   . SKIN CANCER EXCISION     SCC on scalp  . VAGINAL HYSTERECTOMY      Family History  Problem Relation Age of Onset  . Dementia Brother     Social History   Socioeconomic History  . Marital status: Married    Spouse name: Not on file  . Number of children: 4  . Years of education: Not on  file  . Highest education level: Not on file  Occupational History  . 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 Financial resource strain: Not on file  . Food insecurity    Worry: Not on file    Inability: Not on file  . Transportation needs    Medical: Not on file    Non-medical: Not on file  Tobacco Use  . Smoking status: Never Smoker  . Smokeless tobacco: Never Used  Substance and Sexual Activity  . Alcohol use: No    Alcohol/week: 0.0 standard drinks  . Drug use: No  . Sexual activity: Never  Lifestyle  . Physical activity    Days per week: Not on file    Minutes per session: Not on file  . Stress: Not on file  Relationships  . Social Herbalist on phone: Not on file    Gets together: Not on file    Attends religious service: Not on file    Active member of club or organization: Not on file    Attends meetings of clubs or organizations: Not on file    Relationship status: Not on file  . Intimate partner violence    Fear of current or ex partner: Not on file    Emotionally abused: Not on file  Physically abused: Not on file    Forced sexual activity: Not on file  Other Topics Concern  . Not on file  Social History Narrative   No living will   Requests husband as health care POA   Would accept resuscitation attempts   Not sure about tube feeds   Review of Systems Eating fair Weight up slightly No obvious joint pains of note Continues on the meds for osteoporosis    Objective:   Physical Exam  Constitutional: No distress.  Neck: No thyromegaly present.  Cardiovascular: Normal rate, regular rhythm and normal heart sounds. Exam reveals no gallop.  No murmur heard. Respiratory: Effort normal. No respiratory distress. She has no wheezes. She has no rales.  Decreased breath sounds but clear  GI: Soft. There is no abdominal tenderness.  Lymphadenopathy:    She has no cervical adenopathy.  Neurological:   Will answer some direct questions with single word answers. Fairly passive  Psychiatric:  No obvious depression           Assessment & Plan:

## 2019-05-17 NOTE — Assessment & Plan Note (Signed)
Weight is up slightly Husband is working hard to entice her to eat

## 2019-05-17 NOTE — Assessment & Plan Note (Signed)
Continues on bisphosphonate--- goes back only about 4 months

## 2019-05-30 ENCOUNTER — Other Ambulatory Visit: Payer: Self-pay | Admitting: Internal Medicine

## 2019-06-26 ENCOUNTER — Ambulatory Visit: Payer: Medicare Other | Admitting: Podiatry

## 2019-07-10 ENCOUNTER — Ambulatory Visit (INDEPENDENT_AMBULATORY_CARE_PROVIDER_SITE_OTHER): Payer: Medicare Other | Admitting: Podiatry

## 2019-07-10 ENCOUNTER — Encounter: Payer: Self-pay | Admitting: Podiatry

## 2019-07-10 ENCOUNTER — Other Ambulatory Visit: Payer: Self-pay

## 2019-07-10 DIAGNOSIS — M79676 Pain in unspecified toe(s): Secondary | ICD-10-CM

## 2019-07-10 DIAGNOSIS — B351 Tinea unguium: Secondary | ICD-10-CM

## 2019-07-13 NOTE — Progress Notes (Signed)
   SUBJECTIVE Patient presents to office today complaining of elongated, thickened nails that cause pain while ambulating in shoes. She is unable to trim her own nails. Patient is here for further evaluation and treatment.  Past Medical History:  Diagnosis Date  . Cancer ([place])    squamous cell skin ca  . Dementia ([place])   . Osteoporosis, senile   . Patella fracture    Left 2014, bilateral 9/15  . Spastic dysphonia     OBJECTIVE General Patient is awake, alert, and oriented x 3 and in no acute distress. Derm Skin is dry and supple bilateral. Negative open lesions or macerations. Remaining integument unremarkable. Nails are tender, long, thickened and dystrophic with subungual debris, consistent with onychomycosis, 1-5 bilateral. No signs of infection noted.  There is some hyperkeratotic callus tissue noted to the bilateral feet x2 Vasc  DP and PT pedal pulses palpable bilaterally. Temperature gradient within normal limits.  Neuro Epicritic and protective threshold sensation grossly intact bilaterally.  Musculoskeletal Exam No symptomatic pedal deformities noted bilateral. Muscular strength within normal limits.  ASSESSMENT 1. Onychodystrophic nails 1-5 bilateral with hyperkeratosis of nails.  2. Onychomycosis of nail due to dermatophyte bilateral 3. Pain in foot bilateral  PLAN OF CARE 1. Patient evaluated today.  2. Instructed to maintain good pedal hygiene and foot care.  3. Mechanical debridement of nails 1-5 bilaterally performed using a nail nipper. Filed with dremel without incident.  4. Excisional debridement of the hyperkeratotic callus lesions noted was performed using a tissue nipper without incident or bleeding.   5. Return to clinic in 3 mos.    Edrick Kins, DPM [facility]  Dr. Edrick Kins, [address]                                        [place], [place] [address]                Office [phone]  Fax [phone]

## 2019-08-02 ENCOUNTER — Telehealth: Payer: Self-pay | Admitting: *Deleted

## 2019-08-02 DIAGNOSIS — R2681 Unsteadiness on feet: Secondary | ICD-10-CM | POA: Diagnosis not present

## 2019-08-02 DIAGNOSIS — R278 Other lack of coordination: Secondary | ICD-10-CM | POA: Diagnosis not present

## 2019-08-02 DIAGNOSIS — F0390 Unspecified dementia without behavioral disturbance: Secondary | ICD-10-CM | POA: Diagnosis not present

## 2019-08-02 NOTE — Telephone Encounter (Signed)
It is possible for her to have a reaction to shingrix--but I haven't heard about that type of reaction.  For now, I would just recommend trying melatonin at night--like 3mg  (that could be doubled to 6mg  in a few days if the 3mg  doesn't work)

## 2019-08-02 NOTE — Telephone Encounter (Signed)
Pt's spouse called triage wanting to relay a message to Dr. Silvio Pate. Husband said that he wanted to 1st make sure that Dr. Silvio Pate knew that pt fell about 2 weeks ago. She was at the kitchen table and spouse went to get something to eat and pt tried to get up and fell and hit the back of her head and shoulders against the wall. Pt didn't have any bruises or skin tears but she is still sore from the fall as of today. Spouse said that this all started after pt got the shingrex vaccine. He said pt has really "declined" since getting the Shinrix. Pt has almost fallen at least 3 other times but spouse caught her. Pt said he thinks [facility] is aware of fall because she started PT/OT today, but he's not sure.   Pt also isn't sleeping well. Pt is up and down all night. She use to sleep during the day to make up for her not sleeping at night but lately pt isn't sleeping during the day either. Pt's spouse said he isn't sleeping trying to take care of her and is requesting Dr. Silvio Pate send in a medication to help pt sleep at night. Spouse said they just hired a CNA that's going to help a few 4 hrs a few times a week so that will help but he wants something to help her sleep so they can both get some sleep. [facility]

## 2019-08-02 NOTE — Telephone Encounter (Signed)
Spoke to pt's husband. He will try melatonin.

## 2019-08-07 DIAGNOSIS — R2681 Unsteadiness on feet: Secondary | ICD-10-CM | POA: Diagnosis not present

## 2019-08-07 DIAGNOSIS — F0390 Unspecified dementia without behavioral disturbance: Secondary | ICD-10-CM | POA: Diagnosis not present

## 2019-08-07 DIAGNOSIS — R278 Other lack of coordination: Secondary | ICD-10-CM | POA: Diagnosis not present

## 2019-08-08 DIAGNOSIS — R278 Other lack of coordination: Secondary | ICD-10-CM | POA: Diagnosis not present

## 2019-08-08 DIAGNOSIS — F0390 Unspecified dementia without behavioral disturbance: Secondary | ICD-10-CM | POA: Diagnosis not present

## 2019-08-08 DIAGNOSIS — R2681 Unsteadiness on feet: Secondary | ICD-10-CM | POA: Diagnosis not present

## 2019-08-09 DIAGNOSIS — R2681 Unsteadiness on feet: Secondary | ICD-10-CM | POA: Diagnosis not present

## 2019-08-09 DIAGNOSIS — R278 Other lack of coordination: Secondary | ICD-10-CM | POA: Diagnosis not present

## 2019-08-09 DIAGNOSIS — F0390 Unspecified dementia without behavioral disturbance: Secondary | ICD-10-CM | POA: Diagnosis not present

## 2019-08-10 ENCOUNTER — Telehealth: Payer: Self-pay

## 2019-08-10 NOTE — Telephone Encounter (Signed)
Mr Malicoat(DPR signed) left v/m that pt has not had a BM in 3 days. Pt has severe dementia and Mr Arturo encourages fluids as much as possible but pt is not drinking as much as she should and Mr Whalley understands the importance of encouraging fluids. Pt has not taken any med for constipation yet. Mr Yasuda wanted to get Dr Everardo Beals opinion of what pt should take. Pt has had no complaints of abd pain or no other complaints.  Mr Sterle wants to know what to give pt to help with constipation. Mr Grosz request cb today.Please advise. [facility].

## 2019-08-10 NOTE — Telephone Encounter (Signed)
I tried to call pt's husband and unable to reach by phone.

## 2019-08-10 NOTE — Telephone Encounter (Signed)
Unable to reach pts husband by phone left v/m for pt's husband to call back.

## 2019-08-10 NOTE — Telephone Encounter (Signed)
I would recommend miralax 1 capful in a full glass of water daily. It may take a few days to really kick in (but if it doesn't work right away, and she will take it, she can repeat it one or two more times a day)

## 2019-08-10 NOTE — Telephone Encounter (Signed)
Pt's husband called back and I was on a call with another pt and Mr Edris hung up. I tried to call Mr Milberger back and left v/m requesting cb.

## 2019-08-13 DIAGNOSIS — R278 Other lack of coordination: Secondary | ICD-10-CM | POA: Diagnosis not present

## 2019-08-13 DIAGNOSIS — R2681 Unsteadiness on feet: Secondary | ICD-10-CM | POA: Diagnosis not present

## 2019-08-13 DIAGNOSIS — F0390 Unspecified dementia without behavioral disturbance: Secondary | ICD-10-CM | POA: Diagnosis not present

## 2019-08-13 NOTE — Telephone Encounter (Signed)
Spoke to pt's husband. He spoke to a pharmacist over the weekend who recommended a women's gentle laxative. She took 1 on Saturday and 1 yesterday and was able to have a few BMs. I advised him that the Miralax is safe to take daily where as the medication he gave her is as needed. He will get her the Miralax.

## 2019-08-14 DIAGNOSIS — R278 Other lack of coordination: Secondary | ICD-10-CM | POA: Diagnosis not present

## 2019-08-14 DIAGNOSIS — R2681 Unsteadiness on feet: Secondary | ICD-10-CM | POA: Diagnosis not present

## 2019-08-14 DIAGNOSIS — F0390 Unspecified dementia without behavioral disturbance: Secondary | ICD-10-CM | POA: Diagnosis not present

## 2019-08-15 DIAGNOSIS — R278 Other lack of coordination: Secondary | ICD-10-CM | POA: Diagnosis not present

## 2019-08-15 DIAGNOSIS — R2681 Unsteadiness on feet: Secondary | ICD-10-CM | POA: Diagnosis not present

## 2019-08-15 DIAGNOSIS — F0390 Unspecified dementia without behavioral disturbance: Secondary | ICD-10-CM | POA: Diagnosis not present

## 2019-08-16 DIAGNOSIS — R278 Other lack of coordination: Secondary | ICD-10-CM | POA: Diagnosis not present

## 2019-08-16 DIAGNOSIS — F0390 Unspecified dementia without behavioral disturbance: Secondary | ICD-10-CM | POA: Diagnosis not present

## 2019-08-16 DIAGNOSIS — R2681 Unsteadiness on feet: Secondary | ICD-10-CM | POA: Diagnosis not present

## 2019-08-17 DIAGNOSIS — F0390 Unspecified dementia without behavioral disturbance: Secondary | ICD-10-CM | POA: Diagnosis not present

## 2019-08-17 DIAGNOSIS — R278 Other lack of coordination: Secondary | ICD-10-CM | POA: Diagnosis not present

## 2019-08-17 DIAGNOSIS — R2681 Unsteadiness on feet: Secondary | ICD-10-CM | POA: Diagnosis not present

## 2019-08-20 DIAGNOSIS — F0390 Unspecified dementia without behavioral disturbance: Secondary | ICD-10-CM | POA: Diagnosis not present

## 2019-08-20 DIAGNOSIS — R2681 Unsteadiness on feet: Secondary | ICD-10-CM | POA: Diagnosis not present

## 2019-08-20 DIAGNOSIS — R278 Other lack of coordination: Secondary | ICD-10-CM | POA: Diagnosis not present

## 2019-08-21 ENCOUNTER — Ambulatory Visit (INDEPENDENT_AMBULATORY_CARE_PROVIDER_SITE_OTHER): Payer: Medicare Other

## 2019-08-21 DIAGNOSIS — F0390 Unspecified dementia without behavioral disturbance: Secondary | ICD-10-CM | POA: Diagnosis not present

## 2019-08-21 DIAGNOSIS — Z23 Encounter for immunization: Secondary | ICD-10-CM | POA: Diagnosis not present

## 2019-08-21 DIAGNOSIS — R2681 Unsteadiness on feet: Secondary | ICD-10-CM | POA: Diagnosis not present

## 2019-08-21 DIAGNOSIS — R278 Other lack of coordination: Secondary | ICD-10-CM | POA: Diagnosis not present

## 2019-08-22 DIAGNOSIS — R2681 Unsteadiness on feet: Secondary | ICD-10-CM | POA: Diagnosis not present

## 2019-08-22 DIAGNOSIS — F0390 Unspecified dementia without behavioral disturbance: Secondary | ICD-10-CM | POA: Diagnosis not present

## 2019-08-22 DIAGNOSIS — R278 Other lack of coordination: Secondary | ICD-10-CM | POA: Diagnosis not present

## 2019-08-23 DIAGNOSIS — F0390 Unspecified dementia without behavioral disturbance: Secondary | ICD-10-CM | POA: Diagnosis not present

## 2019-08-23 DIAGNOSIS — R2681 Unsteadiness on feet: Secondary | ICD-10-CM | POA: Diagnosis not present

## 2019-08-23 DIAGNOSIS — R278 Other lack of coordination: Secondary | ICD-10-CM | POA: Diagnosis not present

## 2019-08-24 DIAGNOSIS — R2681 Unsteadiness on feet: Secondary | ICD-10-CM | POA: Diagnosis not present

## 2019-08-24 DIAGNOSIS — R278 Other lack of coordination: Secondary | ICD-10-CM | POA: Diagnosis not present

## 2019-08-24 DIAGNOSIS — F0390 Unspecified dementia without behavioral disturbance: Secondary | ICD-10-CM | POA: Diagnosis not present

## 2019-08-27 DIAGNOSIS — F0390 Unspecified dementia without behavioral disturbance: Secondary | ICD-10-CM | POA: Diagnosis not present

## 2019-08-27 DIAGNOSIS — R2681 Unsteadiness on feet: Secondary | ICD-10-CM | POA: Diagnosis not present

## 2019-08-27 DIAGNOSIS — R278 Other lack of coordination: Secondary | ICD-10-CM | POA: Diagnosis not present

## 2019-08-28 DIAGNOSIS — R278 Other lack of coordination: Secondary | ICD-10-CM | POA: Diagnosis not present

## 2019-08-28 DIAGNOSIS — F0390 Unspecified dementia without behavioral disturbance: Secondary | ICD-10-CM | POA: Diagnosis not present

## 2019-08-28 DIAGNOSIS — R2681 Unsteadiness on feet: Secondary | ICD-10-CM | POA: Diagnosis not present

## 2019-08-29 DIAGNOSIS — R2681 Unsteadiness on feet: Secondary | ICD-10-CM | POA: Diagnosis not present

## 2019-08-29 DIAGNOSIS — R278 Other lack of coordination: Secondary | ICD-10-CM | POA: Diagnosis not present

## 2019-08-29 DIAGNOSIS — F0390 Unspecified dementia without behavioral disturbance: Secondary | ICD-10-CM | POA: Diagnosis not present

## 2019-08-30 DIAGNOSIS — R278 Other lack of coordination: Secondary | ICD-10-CM | POA: Diagnosis not present

## 2019-08-30 DIAGNOSIS — R2681 Unsteadiness on feet: Secondary | ICD-10-CM | POA: Diagnosis not present

## 2019-08-30 DIAGNOSIS — F0390 Unspecified dementia without behavioral disturbance: Secondary | ICD-10-CM | POA: Diagnosis not present

## 2019-09-03 ENCOUNTER — Telehealth: Payer: Self-pay | Admitting: *Deleted

## 2019-09-03 DIAGNOSIS — R2681 Unsteadiness on feet: Secondary | ICD-10-CM | POA: Diagnosis not present

## 2019-09-03 DIAGNOSIS — F0390 Unspecified dementia without behavioral disturbance: Secondary | ICD-10-CM | POA: Diagnosis not present

## 2019-09-03 DIAGNOSIS — R278 Other lack of coordination: Secondary | ICD-10-CM | POA: Diagnosis not present

## 2019-09-03 NOTE — Telephone Encounter (Signed)
Patient's husband left a voicemail stating that he thinks that his wife may have a UTI. Mr. Rowberry stated that his wife has a history of UTI's. Mr. Clevinger stated that he had to take her to the bathroom about 6 times yesterday from 7:00-Noon and she was not able to do anything. Mr. Spielmann wants to know if he can just drop a urine off to be checked? Mr. Shinabery stated that his wife is also having problems with constipation. Tried to call patient and got his voicemail. Left message for Mr. Thurston to call back.

## 2019-09-04 DIAGNOSIS — F0390 Unspecified dementia without behavioral disturbance: Secondary | ICD-10-CM | POA: Diagnosis not present

## 2019-09-04 DIAGNOSIS — R278 Other lack of coordination: Secondary | ICD-10-CM | POA: Diagnosis not present

## 2019-09-04 DIAGNOSIS — R2681 Unsteadiness on feet: Secondary | ICD-10-CM | POA: Diagnosis not present

## 2019-09-04 MED ORDER — CEPHALEXIN 500 MG PO CAPS: 500.0000 mg | ORAL_CAPSULE | Freq: Three times a day (TID) | ORAL | 0 refills | Status: DC

## 2019-09-04 NOTE — Telephone Encounter (Signed)
Please call him I did send an antibiotic for her for 3 days in case she has a bladder infection Urinary symptoms like he describes can be secondary to bowel problems---if she is not regular, I would recommend daily Rx with miralax or senna-s to keep her going regularly

## 2019-09-04 NOTE — Telephone Encounter (Signed)
Spouse returned Kathryn Hodges call [facility] number [phone]

## 2019-09-04 NOTE — Telephone Encounter (Signed)
Spoke to pt's husband. He said she does not get up until about 12pm. Still having some urinary frequency and urgency. She has not said she has any dysuria. Has been prone to these in the past. Asking if something that can be sent in or does she need to be seen?  CNA said she had a good BM about 4pm yesterday.

## 2019-09-04 NOTE — Telephone Encounter (Signed)
Spoke to pt's husband. Giving her Miralax everyday for the last 2 weeks. He will monitor her and if the symptoms keep happening and she is not having regular BMs, he will let us know.

## 2019-09-05 DIAGNOSIS — F0390 Unspecified dementia without behavioral disturbance: Secondary | ICD-10-CM | POA: Diagnosis not present

## 2019-09-05 DIAGNOSIS — R278 Other lack of coordination: Secondary | ICD-10-CM | POA: Diagnosis not present

## 2019-09-05 DIAGNOSIS — R2681 Unsteadiness on feet: Secondary | ICD-10-CM | POA: Diagnosis not present

## 2019-09-06 DIAGNOSIS — R2681 Unsteadiness on feet: Secondary | ICD-10-CM | POA: Diagnosis not present

## 2019-09-06 DIAGNOSIS — F0390 Unspecified dementia without behavioral disturbance: Secondary | ICD-10-CM | POA: Diagnosis not present

## 2019-09-06 DIAGNOSIS — R278 Other lack of coordination: Secondary | ICD-10-CM | POA: Diagnosis not present

## 2019-09-11 DIAGNOSIS — F0390 Unspecified dementia without behavioral disturbance: Secondary | ICD-10-CM | POA: Diagnosis not present

## 2019-09-11 DIAGNOSIS — R2681 Unsteadiness on feet: Secondary | ICD-10-CM | POA: Diagnosis not present

## 2019-09-11 DIAGNOSIS — R278 Other lack of coordination: Secondary | ICD-10-CM | POA: Diagnosis not present

## 2019-09-13 DIAGNOSIS — R2681 Unsteadiness on feet: Secondary | ICD-10-CM | POA: Diagnosis not present

## 2019-09-13 DIAGNOSIS — F0390 Unspecified dementia without behavioral disturbance: Secondary | ICD-10-CM | POA: Diagnosis not present

## 2019-09-13 DIAGNOSIS — R278 Other lack of coordination: Secondary | ICD-10-CM | POA: Diagnosis not present

## 2019-09-17 ENCOUNTER — Telehealth: Payer: Self-pay | Admitting: Internal Medicine

## 2019-09-17 DIAGNOSIS — F0390 Unspecified dementia without behavioral disturbance: Secondary | ICD-10-CM | POA: Diagnosis not present

## 2019-09-17 DIAGNOSIS — R278 Other lack of coordination: Secondary | ICD-10-CM | POA: Diagnosis not present

## 2019-09-17 DIAGNOSIS — R2681 Unsteadiness on feet: Secondary | ICD-10-CM | POA: Diagnosis not present

## 2019-09-17 NOTE — Telephone Encounter (Signed)
Pt's husband dropped off physician statement to be filled out by Dr. Silvio Pate. Placed in [facility]. Pt's husband will like call when form has been faxed.   CB [phone]

## 2019-09-18 DIAGNOSIS — Z0279 Encounter for issue of other medical certificate: Secondary | ICD-10-CM

## 2019-09-18 NOTE — Telephone Encounter (Signed)
Form placed in Dr Letvak's inbox on his desk 

## 2019-09-18 NOTE — Telephone Encounter (Signed)
Pt s husband (DPR signed) left v/m that pt went to bathroom 5 - 6 times between 6 AM and 10 :30 AM; pt had one good BM and the other times pt had urge to go but did not; Mr Royston is not sure if pt had urge to urinate or have another BM but he thinks is related to urge to urinate.  Mr Ebersole wants to know if Urinalysis could be done without pt coming into office.Mr Mceuen thinks pt did have slight improvement with urinary symptoms(frequency of wanting to urinate) while taking the abx and a few days after.No complaints of burning or pain when urinates. No fever or chills and no complaints of any pain;Pt has no covid symptoms, no travel and no known exposure to + covid. [facility]. Pt is continuing with Miralax daily and that seems to be working well to help pt have regular BMs. Mr Meader request cb after reviewed by Dr Silvio Pate.

## 2019-09-18 NOTE — Telephone Encounter (Signed)
Form done $20 charge 

## 2019-09-19 MED ORDER — NITROFURANTOIN MONOHYD MACRO 100 MG PO CAPS: 100.0000 mg | ORAL_CAPSULE | Freq: Two times a day (BID) | ORAL | 0 refills | Status: DC

## 2019-09-19 NOTE — Telephone Encounter (Signed)
Spoke to pt's husband. He is not really comfortable with just starting another antibiotic without getting the urine checked first. But said he will do as Dr Silvio Pate has suggested. He will go get the antibiotic.

## 2019-09-19 NOTE — Addendum Note (Signed)
Addended by: Viviana Simpler I on: 09/19/2019 07:21 AM   Modules accepted: Orders

## 2019-09-19 NOTE — Telephone Encounter (Signed)
Please let him know I sent a different antibiotic for her to try to see if that eases her symptoms

## 2019-09-19 NOTE — Telephone Encounter (Signed)
Left message to call office

## 2019-09-19 NOTE — Telephone Encounter (Signed)
I am not comfortable with just checking a urine---and not a person. If she has continued symptoms, I should probably see her

## 2019-09-19 NOTE — Telephone Encounter (Signed)
Spoke to pt's husband. He will go get the antibiotic.

## 2019-09-19 NOTE — Telephone Encounter (Signed)
I notified patient's husband that patient's form was faxed to [employer]. He was notified of $20 charge.  I mailed the original to patient.

## 2019-09-20 DIAGNOSIS — R2681 Unsteadiness on feet: Secondary | ICD-10-CM | POA: Diagnosis not present

## 2019-09-20 DIAGNOSIS — R278 Other lack of coordination: Secondary | ICD-10-CM | POA: Diagnosis not present

## 2019-09-20 DIAGNOSIS — F0390 Unspecified dementia without behavioral disturbance: Secondary | ICD-10-CM | POA: Diagnosis not present

## 2019-09-21 ENCOUNTER — Ambulatory Visit (INDEPENDENT_AMBULATORY_CARE_PROVIDER_SITE_OTHER): Payer: Medicare Other | Admitting: Family Medicine

## 2019-09-21 ENCOUNTER — Other Ambulatory Visit: Payer: Self-pay

## 2019-09-21 ENCOUNTER — Encounter: Payer: Self-pay | Admitting: Family Medicine

## 2019-09-21 ENCOUNTER — Ambulatory Visit (INDEPENDENT_AMBULATORY_CARE_PROVIDER_SITE_OTHER)
Admission: RE | Admit: 2019-09-21 | Discharge: 2019-09-21 | Disposition: A | Discharge status: Home / Self Care | Payer: Medicare Other | Source: Ambulatory Visit | Attending: Family Medicine | Admitting: Family Medicine

## 2019-09-21 VITALS — BP 100/62 | HR 90 | Temp 97.6°F | Ht 62.0 in | Wt 73.2 lb

## 2019-09-21 DIAGNOSIS — S42034A Nondisplaced fracture of lateral end of right clavicle, initial encounter for closed fracture: Secondary | ICD-10-CM | POA: Insufficient documentation

## 2019-09-21 DIAGNOSIS — M25511 Pain in right shoulder: Secondary | ICD-10-CM | POA: Diagnosis not present

## 2019-09-21 DIAGNOSIS — M810 Age-related osteoporosis without current pathological fracture: Secondary | ICD-10-CM

## 2019-09-21 NOTE — Progress Notes (Signed)
Chief Complaint  Patient presents with  . Fall  . Shoulder Injury    right shoulder     History of Present Illness: HPI   85 year old female patient of Dr. Alla German with alzheimer's dementia and osteoporosis.   Her husband is with her at appt today.   She lives at  [facility]. He reports last night 8 PM she lost balance and fell. She refuses to use her walker. She landed on her right shoulder and back of head on wall.  Abrasion on right elbow  She is moving right arm minimally.  No change in mental status.  Treated shoulder with ice. No LOC.   COVID 19 screen No recent travel or known exposure to COVID19 The patient denies respiratory symptoms of COVID 19 at this time.  The importance of social distancing was discussed today.   Review of Systems  Constitutional: Negative for chills and fever.  HENT: Negative for congestion and ear pain.   Eyes: Negative for pain and redness.  Respiratory: Negative for cough and shortness of breath.   Cardiovascular: Negative for chest pain, palpitations and leg swelling.  Gastrointestinal: Negative for abdominal pain, blood in stool, constipation, diarrhea, nausea and vomiting.  Genitourinary: Negative for dysuria.  Musculoskeletal: Negative for falls and myalgias.  Skin: Negative for rash.  Neurological: Negative for dizziness.  Psychiatric/Behavioral: Negative for depression. The patient is not nervous/anxious.       Past Medical History:  Diagnosis Date  . Cancer ([place])    squamous cell skin ca  . Dementia ([place])   . Osteoporosis, senile   . Patella fracture    Left 2014, bilateral 9/15  . Spastic dysphonia     reports that she has never smoked. She has never used smokeless tobacco. She reports that she does not drink alcohol or use drugs.   Current Outpatient Medications:  .  ergocalciferol (VITAMIN D2) 1.25 MG (50000 UT) capsule, Take 50,000 Units by mouth every 30 (thirty) days., Disp: , Rfl:  .  ibandronate (BONIVA) 150  MG tablet, TAKE 1 TABLET BY MOUTH EVERY 30 DAYS. TAKE IN THE MORNING WITH A FULLGLASS OFWATER & ON AN EMPTY STOMACH, Disp: 1 tablet, Rfl: 11 .  Multiple Vitamins-Minerals (MULTIVITAMIN ADULT PO), Take 1 tablet by mouth daily., Disp: , Rfl:  .  nitrofurantoin, macrocrystal-monohydrate, (MACROBID) 100 MG capsule, Take 1 capsule (100 mg total) by mouth 2 (two) times daily., Disp: 6 capsule, Rfl: 0   Observations/Objective: Blood pressure 100/62, pulse 90, temperature 97.6 F (36.4 C), height 5\' 2"  (1.575 m), weight 73 lb 4 oz (33.2 kg), SpO2 96 %.  Physical Exam Constitutional:      General: She is not in acute distress.    Appearance: Normal appearance. She is well-developed. She is not ill-appearing or toxic-appearing.  HENT:     Head: Normocephalic.     Right Ear: Hearing, tympanic membrane, ear canal and external ear normal. Tympanic membrane is not erythematous, retracted or bulging.     Left Ear: Hearing, tympanic membrane, ear canal and external ear normal. Tympanic membrane is not erythematous, retracted or bulging.     Nose: No mucosal edema or rhinorrhea.     Right Sinus: No maxillary sinus tenderness or frontal sinus tenderness.     Left Sinus: No maxillary sinus tenderness or frontal sinus tenderness.     Mouth/Throat:     Pharynx: Uvula midline.  Eyes:     General: Lids are normal. Lids are everted, no foreign bodies  appreciated.     Conjunctiva/sclera: Conjunctivae normal.     Pupils: Pupils are equal, round, and reactive to light.  Neck:     Musculoskeletal: Normal range of motion and neck supple.     Thyroid: No thyroid mass or thyromegaly.     Vascular: No carotid bruit.     Trachea: Trachea normal.  Cardiovascular:     Rate and Rhythm: Normal rate and regular rhythm.     Pulses: Normal pulses.     Heart sounds: Normal heart sounds, S1 normal and S2 normal. No murmur. No friction rub. No gallop.   Pulmonary:     Effort: Pulmonary effort is normal. No tachypnea or  respiratory distress.     Breath sounds: Normal breath sounds. No decreased breath sounds, wheezing, rhonchi or rales.  Abdominal:     General: Bowel sounds are normal.     Palpations: Abdomen is soft.     Tenderness: There is no abdominal tenderness.  Musculoskeletal:     Right shoulder: She exhibits decreased range of motion, tenderness and bony tenderness. She exhibits no swelling.     Right elbow: She exhibits laceration. She exhibits normal range of motion, no swelling, no effusion and no deformity.     Right wrist: Normal. She exhibits normal range of motion, no tenderness and no bony tenderness.     Cervical back: She exhibits normal range of motion, no tenderness and no bony tenderness.     Thoracic back: Normal.  Skin:    General: Skin is warm and dry.     Findings: No rash.  Neurological:     Mental Status: She is alert. She is disoriented.     Cranial Nerves: Cranial nerves are intact.     Sensory: Sensation is intact.     Comments:  Shuffling gait  Psychiatric:        Mood and Affect: Mood is not anxious or depressed.        Speech: Speech normal.        Behavior: Behavior normal. Behavior is cooperative.        Thought Content: Thought content normal.        Judgment: Judgment normal.      Assessment and Plan   Closed nondisplaced fracture of lateral end of right clavicle Family wishes minimal intervention. Put pt in sling, treat with tylenol and ice.  Follow up with SM for recs on re-evaluating healing and placement.     Eliezer Lofts, MD

## 2019-09-21 NOTE — Patient Instructions (Signed)
Wear sling. Tylenol for pain.  Clavicle Fracture A clavicle fracture is a break in the long bone that connects the shoulder to the chest wall (clavicle). The clavicle is also called the collarbone. A clavicle fracture is a common injury that can happen at any age. What are the causes? Common causes of a clavicle fracture include:  A hard, direct hit (blow) to the shoulder.  A car accident.  A fall, especially if you try to break your fall with an outstretched arm. What increases the risk? You are more likely to develop this condition if:  You are younger than 60 or older than 56. Most clavicle fractures happen to people who are younger than 25.  You are a female.  You play contact sports. What are the signs or symptoms? Symptoms of this condition include:  Pain.  Difficulty moving the arm.  A shoulder that drops downward and forward.  Pain when trying to lift the shoulder.  Bruising, swelling, and tenderness over the clavicle.  A grinding noise when you try to move the shoulder.  A bump over the clavicle. How is this diagnosed? This condition is diagnosed based on:  Your medical history.  A physical exam.  X-rays to determine the position of your clavicle. How is this treated? Treatment for this condition depends on the position of your clavicle after the fracture:  If the broken ends of the bone are not out of place, your health care provider may put your arm in a sling.  If the broken ends of the bone are out of place, you may need surgery. Surgery may involve placing screws, pins, or plates to keep your clavicle stable while it heals. When your health care provider thinks your fracture has healed enough, you may have to do physical therapy to regain normal movement and build up your arm strength. Follow these instructions at home: If you have a sling:   Wear the sling as told by your health care provider. Remove it only as told by your health care provider.   Loosen the sling if your fingers tingle, become numb, or turn cold and blue.  Do not lift your arm. Keep it across your chest.  Keep the sling clean.  Ask your health care provider if you may remove the sling for bathing. ? If your sling is not waterproof, do not let it get wet. Cover the sling with a watertight covering if you take a bath or a shower while wearing it. ? If you may remove your sling when you take a bath or a shower, keep your shoulder in the same position as when the sling is on. Managing pain, stiffness, and swelling   If directed, put ice on the injured area: ? If you have a removable sling, remove it as told by your health care provider. ? Put ice in a plastic bag. ? Place a towel between your skin and the bag. ? Leave the ice on for 20 minutes, 2-3 times a day. Activity  Avoid activities that make your symptoms worse for 4-6 weeks, or as long as directed.  Ask your health care provider when it is safe for you to drive.  Do exercises as told by your health care provider. General instructions  Do not use any products that contain nicotine or tobacco, such as cigarettes and e-cigarettes. These can delay bone healing. If you need help quitting, ask your health care provider.  Take over-the-counter and prescription medicines only as told by your  health care provider.  Keep all follow-up visits as told by your health care provider. This is important. Contact a health care provider if:  Your medicine is not helping to relieve pain and swelling. Get help right away if:  Your arm is numb, cold, or pale, even when your splint is loose. Summary  The clavicle, also called the collarbone, is the long bone that connects the shoulder to the chest wall.  Treatment for this condition depends on the position of your clavicle after the fracture.  You may need to do physical therapy after your injury has healed enough.  If you have a sling, wear the sling as told by your  health care provider. This information is not intended to replace advice given to you by your health care provider. Make sure you discuss any questions you have with your health care provider. Document Released: 08/25/2005 Document Revised: 10/02/2018 Document Reviewed: 10/04/2016 Elsevier Patient Education  2020 [employer].

## 2019-09-21 NOTE — Assessment & Plan Note (Signed)
Family wishes minimal intervention. Put pt in sling, treat with tylenol and ice.  Follow up with SM for recs on re-evaluating healing and placement.

## 2019-09-24 DIAGNOSIS — F0390 Unspecified dementia without behavioral disturbance: Secondary | ICD-10-CM | POA: Diagnosis not present

## 2019-09-24 DIAGNOSIS — R278 Other lack of coordination: Secondary | ICD-10-CM | POA: Diagnosis not present

## 2019-09-24 DIAGNOSIS — R2681 Unsteadiness on feet: Secondary | ICD-10-CM | POA: Diagnosis not present

## 2019-09-26 ENCOUNTER — Encounter: Payer: Self-pay | Admitting: Family Medicine

## 2019-09-26 ENCOUNTER — Other Ambulatory Visit: Payer: Self-pay

## 2019-09-26 ENCOUNTER — Ambulatory Visit (INDEPENDENT_AMBULATORY_CARE_PROVIDER_SITE_OTHER): Payer: Medicare Other | Admitting: Family Medicine

## 2019-09-26 VITALS — BP 110/80 | HR 113 | Temp 98.4°F | Ht 62.0 in | Wt 73.5 lb

## 2019-09-26 DIAGNOSIS — R531 Weakness: Secondary | ICD-10-CM

## 2019-09-26 DIAGNOSIS — G309 Alzheimer's disease, unspecified: Secondary | ICD-10-CM | POA: Diagnosis not present

## 2019-09-26 DIAGNOSIS — S42034A Nondisplaced fracture of lateral end of right clavicle, initial encounter for closed fracture: Secondary | ICD-10-CM | POA: Diagnosis not present

## 2019-09-26 DIAGNOSIS — F0280 Dementia in other diseases classified elsewhere without behavioral disturbance: Secondary | ICD-10-CM | POA: Diagnosis not present

## 2019-09-26 DIAGNOSIS — R269 Unspecified abnormalities of gait and mobility: Secondary | ICD-10-CM | POA: Diagnosis not present

## 2019-09-26 NOTE — Progress Notes (Signed)
Kathryn Musson T. Leyli Kevorkian, MD Primary Care and [place] at [facility] [place], [address] Phone: [phone]  FAX: [phone]  Kathryn Hodges - 85 y.o. female  MRN [id number]  Date of Birth: Apr 24, 1931  Visit Date: 09/26/2019  PCP: Kathryn Carbon, MD  Referred by: Kathryn Carbon, MD  Chief Complaint  Patient presents with  . Follow-up    Right Clavicle Fx   Subjective:   Kathryn Hodges is a 85 y.o. very pleasant female patient with Body mass index is 13.44 kg/m. who presents with the following:  DOI:09/20/2019  She is a pleasant elderly lady with advanced Alzheimer's dementia.  She does have a BMI of 14 and weighs only 74 pounds.  She is here with her husband who provides the history.  She fell on the date above, and lost her balance.  She has had difficulty with balance and falls as her dementia has progressed.  Also relevant and she has some significant osteoporosis.  She has been resistant and would not use her walker per her husband.  When she fell, she landed on the point of her right shoulder.  Prior films are reviewed by me independently.  There is evidence of a nondisplaced distal clavicle fracture.  No significant angulation. Electronically Signed  By: Kathryn Loffler, MD On: 09/26/2019  2:40 PM EDT   F/u R sided distal clavicle fracture. Alz D Check elbow and whole R UE  Fall 8 years ago. ??? PT at [facility]    Past Medical History, Surgical History, Social History, Family History, Problem List, Medications, and Allergies have been reviewed and updated if relevant.  Patient Active Problem List   Diagnosis Date Noted  . Acute pain of right shoulder 09/21/2019  . Closed nondisplaced fracture of lateral end of right clavicle 09/21/2019  . Dizziness 08/31/2018  . Mood disorder ([place]) 08/31/2018  . Lumbar compression fracture ([place]) 06/12/2018  . Osteoporosis 06/12/2018  . Back pain 05/08/2018   . Anterior knee pain 07/05/2017  . Preventative health care 08/16/2016  . Advance directive discussed with patient 08/16/2016  . Malnutrition of moderate degree ([place]) 07/02/2016  . Spastic dysphonia   . Alzheimer's dementia ([place])   . Chronic cystitis 10/17/2013    Past Medical History:  Diagnosis Date  . Cancer ([place])    squamous cell skin ca  . Dementia ([place])   . Osteoporosis, senile   . Patella fracture    Left 2014, bilateral 9/15  . Spastic dysphonia     Past Surgical History:  Procedure Laterality Date  . ROTATOR CUFF REPAIR Left   . SKIN CANCER EXCISION     SCC on scalp  . VAGINAL HYSTERECTOMY      Social History   Socioeconomic History  . Marital status: Married    Spouse name: Not on file  . Number of children: 4  . Years of education: Not on file  . Highest education level: Not on file  Occupational History  . 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 Financial resource strain: Not on file  . Food insecurity    Worry: Not on file    Inability: Not on file  . Transportation needs    Medical: Not on file    Non-medical: Not on file  Tobacco Use  . Smoking status: Never Smoker  . Smokeless tobacco: Never Used  Substance and Sexual Activity  .  Alcohol use: No    Alcohol/week: 0.0 standard drinks  . Drug use: No  . Sexual activity: Never  Lifestyle  . Physical activity    Days per week: Not on file    Minutes per session: Not on file  . Stress: Not on file  Relationships  . Social Herbalist on phone: Not on file    Gets together: Not on file    Attends religious service: Not on file    Active member of club or organization: Not on file    Attends meetings of clubs or organizations: Not on file    Relationship status: Not on file  . Intimate partner violence    Fear of current or ex partner: Not on file    Emotionally abused: Not on file    Physically abused: Not on file    Forced sexual  activity: Not on file  Other Topics Concern  . Not on file  Social History Narrative   No living will   Requests husband as health care POA   Would accept resuscitation attempts   Not sure about tube feeds    Family History  Problem Relation Age of Onset  . Dementia Brother     Allergies  Allergen Reactions  . Sulfa Antibiotics Hives, Itching, Rash and Other (See Comments)    Other Reaction: Other reaction Other Reaction: Other reaction Other reaction(s): Other (See Comments) Other Reaction: Other reaction    Medication list reviewed and updated in full in [place].  GEN: No fevers, chills. Nontoxic. Primarily MSK c/o today. MSK: Detailed in the HPI GI: tolerating PO intake without difficulty Neuro: No numbness, parasthesias, or tingling associated. Otherwise the pertinent positives of the ROS are noted above.   Objective:   BP 110/80   Pulse (!) 113   Temp 98.4 F (36.9 C) (Temporal)   Ht 5\' 2"  (1.575 m)   Wt 73 lb 8 oz (33.3 kg)   SpO2 98%   BMI 13.44 kg/m    GEN: WDWN, NAD, not oriented, quiet HEENT: Atraumatic, Normocephalic.  Ears and Nose: No external deformity. EXTR: No clubbing/cyanosis/edema NEURO: Normal gait.  PSYCH: calm  Full range of motion at the elbow in all directions and nontender here.  The wrist is fully mobile, and she has a decent grip strength and is nontender throughout all of the bone and wrist anatomy.  At the shoulder the patient has notable distal clavicle pain.  Due to pain, she will not move her shoulder around much.  She has no pain along the humeral shaft.  No appreciable pain at the scapula..    Radiology: Dg Shoulder Right  Result Date: 09/21/2019 CLINICAL DATA:  Right shoulder pain after fall.  Initial encounter. EXAM: RIGHT SHOULDER - 2+ VIEW COMPARISON:  None. FINDINGS: The patient has a nondisplaced fracture of the distal clavicle just proximal to the [facility] joint. No other acute bony or joint abnormality. Imaged  lung parenchyma and ribs appear normal. IMPRESSION: Acute nondisplaced fracture of the distal clavicle. The [facility] joint is intact and no other abnormality seen. Electronically Signed   By: Kathryn Hodges M.D.   On: 09/21/2019 14:49    Assessment and Plan:     ICD-10-CM   1. Closed nondisplaced fracture of acromial end of right clavicle, initial encounter  S42.034A Ambulatory referral to Physical Therapy  2. Alzheimer's dementia without behavioral disturbance, unspecified timing of dementia onset ([place])  G30.9 Ambulatory referral to Physical Therapy  F02.80   3. Gait disturbance  R26.9 Ambulatory referral to Physical Therapy  4. Weakness  R53.1 Ambulatory referral to Physical Therapy   >25 minutes spent in face to face time with patient, >50% spent in counselling or coordination of care   This is a challenging case clinically.  With the patient's advanced Alzheimer, she would not and did not tolerate a sling.  The management of this condition is necessarily impacted by her advanced Alzheimer's degrees.  I discussed with the patient's husband that in an ideal setting from a textbook, she would be immobilized in a sling, but that is not a possibility in this case and she would not tolerate it.  Her husband tells me that when she was given her sling she immediately took it off.  I think in this case we will do the best we can and keep her out of any form of immobilization.  Recommended that she keep her arm close to her body if possible, but this may also be a challenge significantly.  Concern for additional fracture is high.  She would not be able to use her walker.  Recommended use of a cane in the noninjured side.  She is going to be admitted to memory care at [facility], so she will not be able to follow-up with me.  If she has further difficulty, I hope to my partner Dr. Silvio Pate will be able to help at least reassess her.  I gone over some Tylenol and ibuprofen dosing with them as well.  She is  in significant pain.  Opioids would be entirely unreasonable in this case.  The husband knows that there are some potential negative outcomes, but in this case both he and I think that the potential benefits outweigh risk.  I spoke to the head of physical therapy at [facility], and they are going to help her as limited by pain to help with quality of life.  Follow-up: No follow-ups on file.  No orders of the defined types were placed in this encounter.  Orders Placed This Encounter  Procedures  . Ambulatory referral to Physical Therapy    Signed,  Frederico Hamman T. Raju Coppolino, MD   Outpatient Encounter Medications as of 09/26/2019  Medication Sig  . ergocalciferol (VITAMIN D2) 1.25 MG (50000 UT) capsule Take 50,000 Units by mouth every 30 (thirty) days.  Marland Kitchen ibandronate (BONIVA) 150 MG tablet TAKE 1 TABLET BY MOUTH EVERY 30 DAYS. TAKE IN THE MORNING WITH A FULLGLASS OFWATER & ON AN EMPTY STOMACH  . Multiple Vitamins-Minerals (MULTIVITAMIN ADULT PO) Take 1 tablet by mouth daily.  . [DISCONTINUED] nitrofurantoin, macrocrystal-monohydrate, (MACROBID) 100 MG capsule Take 1 capsule (100 mg total) by mouth 2 (two) times daily.   No facility-administered encounter medications on file as of 09/26/2019.

## 2019-09-26 NOTE — Patient Instructions (Addendum)
Tylenol 325 mg, 3 tablets 4 times a day.  OR TYLENOL 500 MG (EXTRA STRENGTH) 2 TABLETS 4 TIMES A DAY.    Over the counter ibuprofen 200 mg, 1 tablet 4 times a day with your tylenol

## 2019-10-01 ENCOUNTER — Telehealth: Payer: Self-pay | Admitting: *Deleted

## 2019-10-01 ENCOUNTER — Other Ambulatory Visit (INDEPENDENT_AMBULATORY_CARE_PROVIDER_SITE_OTHER): Payer: Medicare Other

## 2019-10-01 DIAGNOSIS — R3915 Urgency of urination: Secondary | ICD-10-CM | POA: Diagnosis not present

## 2019-10-01 DIAGNOSIS — R350 Frequency of micturition: Secondary | ICD-10-CM

## 2019-10-01 LAB — POC URINALSYSI DIPSTICK (AUTOMATED)
Bilirubin, UA: NEGATIVE
Blood, UA: NEGATIVE
Glucose, UA: NEGATIVE
Ketones, UA: NEGATIVE
Leukocytes, UA: NEGATIVE
Nitrite, UA: NEGATIVE
Protein, UA: NEGATIVE
Spec Grav, UA: 1.025 (ref 1.010–1.025)
Urobilinogen, UA: 0.2 E.U./dL
pH, UA: 5.5 (ref 5.0–8.0)

## 2019-10-01 NOTE — Telephone Encounter (Signed)
Patient's husband notified as instructed by telephone and verbalized understanding. Mr. Griesemer stated that it will be after 2:00 before he can drop the urine off because he will have to wait for the CNA to come and sit with his wife. Lab appointment set up for urine drop off.

## 2019-10-01 NOTE — Telephone Encounter (Signed)
Okay to have him bring a urine specimen for urinalysis and C&S He can use a clean container if he can't get a specimen cup from nurses there-----have him bring it as soon as he gets it (or refrigerate)

## 2019-10-01 NOTE — Telephone Encounter (Addendum)
Patient's husband called stating that he is sure that she has another UTI. Mr. Canney stated that she has a broken shoulder and it will be impossible to bring her to the office. Mr. Doke wants to know if he can bring in a urine specimen to be checked? Mr. Kopec stated that he lives at [facility] and thinks that he can get a container there. Mr. Charbonneau stated that she has had multiple UTI's.. Mr. Reichwein stated that she is having a lot of urgency and frequency and unable to communicate if she is having any pain or discomfort. Patient's husband stated that this has been going on for about 3-4 weeks. Pharmacy [facility]

## 2019-10-03 LAB — URINE CULTURE
MICRO NUMBER:: 1054524
Result:: NO GROWTH
SPECIMEN QUALITY:: ADEQUATE

## 2019-10-19 ENCOUNTER — Other Ambulatory Visit: Payer: Self-pay

## 2019-10-31 ENCOUNTER — Other Ambulatory Visit: Payer: Self-pay | Admitting: Internal Medicine

## 2019-11-02 DIAGNOSIS — F0390 Unspecified dementia without behavioral disturbance: Secondary | ICD-10-CM | POA: Diagnosis not present

## 2019-11-02 DIAGNOSIS — R278 Other lack of coordination: Secondary | ICD-10-CM | POA: Diagnosis not present

## 2019-11-02 DIAGNOSIS — R2681 Unsteadiness on feet: Secondary | ICD-10-CM | POA: Diagnosis not present

## 2019-11-05 DIAGNOSIS — R278 Other lack of coordination: Secondary | ICD-10-CM | POA: Diagnosis not present

## 2019-11-05 DIAGNOSIS — R2681 Unsteadiness on feet: Secondary | ICD-10-CM | POA: Diagnosis not present

## 2019-11-05 DIAGNOSIS — F0390 Unspecified dementia without behavioral disturbance: Secondary | ICD-10-CM | POA: Diagnosis not present

## 2019-11-06 ENCOUNTER — Ambulatory Visit (INDEPENDENT_AMBULATORY_CARE_PROVIDER_SITE_OTHER): Payer: Medicare Other | Admitting: Podiatry

## 2019-11-06 ENCOUNTER — Other Ambulatory Visit: Payer: Self-pay

## 2019-11-06 ENCOUNTER — Encounter: Payer: Self-pay | Admitting: Podiatry

## 2019-11-06 DIAGNOSIS — M79676 Pain in unspecified toe(s): Secondary | ICD-10-CM

## 2019-11-06 DIAGNOSIS — B351 Tinea unguium: Secondary | ICD-10-CM

## 2019-11-06 DIAGNOSIS — L989 Disorder of the skin and subcutaneous tissue, unspecified: Secondary | ICD-10-CM

## 2019-11-08 DIAGNOSIS — R2681 Unsteadiness on feet: Secondary | ICD-10-CM | POA: Diagnosis not present

## 2019-11-08 DIAGNOSIS — R278 Other lack of coordination: Secondary | ICD-10-CM | POA: Diagnosis not present

## 2019-11-08 DIAGNOSIS — F0390 Unspecified dementia without behavioral disturbance: Secondary | ICD-10-CM | POA: Diagnosis not present

## 2019-11-09 NOTE — Progress Notes (Signed)
   SUBJECTIVE Patient presents to office today complaining of elongated, thickened nails that cause pain while ambulating in shoes. She is unable to trim her own nails. Patient is here for further evaluation and treatment.  Past Medical History:  Diagnosis Date  . Cancer ([place])    squamous cell skin ca  . Dementia ([place])   . Osteoporosis, senile   . Patella fracture    Left 2014, bilateral 9/15  . Spastic dysphonia     OBJECTIVE General Patient is awake, alert, and oriented x 3 and in no acute distress. Derm Skin is dry and supple bilateral. Negative open lesions or macerations. Remaining integument unremarkable. Nails are tender, long, thickened and dystrophic with subungual debris, consistent with onychomycosis, 1-5 bilateral. No signs of infection noted.  There is some hyperkeratotic callus tissue noted to the bilateral feet x2 Vasc  DP and PT pedal pulses palpable bilaterally. Temperature gradient within normal limits.  Neuro Epicritic and protective threshold sensation grossly intact bilaterally.  Musculoskeletal Exam No symptomatic pedal deformities noted bilateral. Muscular strength within normal limits.  ASSESSMENT 1. Onychodystrophic nails 1-5 bilateral with hyperkeratosis of nails.  2. Onychomycosis of nail due to dermatophyte bilateral 3. Pain in foot bilateral  PLAN OF CARE 1. Patient evaluated today.  2. Instructed to maintain good pedal hygiene and foot care.  3. Mechanical debridement of nails 1-5 bilaterally performed using a nail nipper. Filed with dremel without incident.  4. Excisional debridement of the hyperkeratotic callus lesions noted was performed using a tissue nipper without incident or bleeding.   5. Return to clinic in 3 mos.    Edrick Kins, DPM [facility]  Dr. Edrick Kins, [place]                                        [place], [place] [address]                Office [phone]  Fax [phone]

## 2019-11-12 ENCOUNTER — Ambulatory Visit (INDEPENDENT_AMBULATORY_CARE_PROVIDER_SITE_OTHER): Payer: Medicare Other | Admitting: Internal Medicine

## 2019-11-12 ENCOUNTER — Encounter: Payer: Self-pay | Admitting: Internal Medicine

## 2019-11-12 ENCOUNTER — Other Ambulatory Visit: Payer: Self-pay

## 2019-11-12 VITALS — BP 110/70 | HR 93 | Temp 97.2°F | Ht 62.0 in | Wt <= 1120 oz

## 2019-11-12 DIAGNOSIS — Z7189 Other specified counseling: Secondary | ICD-10-CM

## 2019-11-12 DIAGNOSIS — Z0000 Encounter for general adult medical examination without abnormal findings: Secondary | ICD-10-CM | POA: Diagnosis not present

## 2019-11-12 DIAGNOSIS — G301 Alzheimer's disease with late onset: Secondary | ICD-10-CM

## 2019-11-12 DIAGNOSIS — F0280 Dementia in other diseases classified elsewhere without behavioral disturbance: Secondary | ICD-10-CM

## 2019-11-12 DIAGNOSIS — E440 Moderate protein-calorie malnutrition: Secondary | ICD-10-CM

## 2019-11-12 DIAGNOSIS — F39 Unspecified mood [affective] disorder: Secondary | ICD-10-CM

## 2019-11-12 DIAGNOSIS — M810 Age-related osteoporosis without current pathological fracture: Secondary | ICD-10-CM | POA: Diagnosis not present

## 2019-11-12 MED ORDER — MIRTAZAPINE 15 MG PO TABS: 15.0000 mg | ORAL_TABLET | Freq: Every day | ORAL | 5 refills | Status: DC

## 2019-11-12 NOTE — Assessment & Plan Note (Signed)
Moderate now Discussed skilled facility---clearly pros and cons but he will consider when the new building opens

## 2019-11-12 NOTE — Assessment & Plan Note (Signed)
Further weight loss Will try mirtazapine--discussed possible side effects Discussed ensure, etc

## 2019-11-12 NOTE — Assessment & Plan Note (Signed)
See social history Husband still wants her to have resuscitation attempts

## 2019-11-12 NOTE — Assessment & Plan Note (Signed)
Will continue the bisphosphonate On for not quite a year Vitamin D

## 2019-11-12 NOTE — Assessment & Plan Note (Signed)
Clearly seems more depressed per husband Will see if the mirtazapine will also help this

## 2019-11-12 NOTE — Assessment & Plan Note (Signed)
I have personally reviewed the Medicare Annual Wellness questionnaire and have noted 1. The patient's medical and social history 2. Their use of alcohol, tobacco or illicit drugs 3. Their current medications and supplements 4. The patient's functional ability including ADL's, fall risks, home safety risks and hearing or visual             impairment. 5. Diet and physical activities 6. Evidence for depression or mood disorders  The patients weight, height, BMI and visual acuity have been recorded in the chart I have made referrals, counseling and provided education to the patient based review of the above and I have provided the pt with a written personalized care plan for preventive services.  I have provided you with a copy of your personalized plan for preventive services. Please take the time to review along with your updated medication list.  Possible reaction to the shingrix--will hold off on #2 Annual flu vaccine--had for this year No cancer screening

## 2019-11-12 NOTE — Progress Notes (Signed)
Subjective:    Patient ID: Kathryn Hodges, female    DOB: 01/22/1931, 85 y.o.   MRN: [id number]  HPI Here with husband for Medicare wellness visit and follow up of chronic health conditions  This visit occurred during the SARS-CoV-2 public health emergency.  Safety protocols were in place, including screening questions prior to the visit, additional usage of staff PPE, and extensive cleaning of exam room while observing appropriate contact time as indicated for disinfecting solutions.   Reviewed advanced directives Reviewed other doctors--derm Dr Delice Lesch in [facility] but not going there anymore Walks on her own--but not stable. Husband tries to provide contact guard Husband does all instrumental ADLs. Aide for bathing and help---5 days per week for 4 hours. He may increase soon to give him relief at night (but now sleeping better recently). Incontinent--though husband tries to bring her into the bathroom. No alcohol or tobacco Not able to exercise He is unsure how good her vision is--but seems okay Hearing is fair Known dementia  Has lost even more weight Now down under 70# He never tried the mirtazapine 2 years ago He tries to get her to drink ensure. He still gets food from the [employer] and other restaurants  Still gets urinary urgency Urinalysis was negative Up 6-7 times at night at times---better lately  [place] with fractured clavicle about 2 months ago Pain seems to be better Did do some therapy after this Children wonder about pain--but not clear cut Husband has stopped regular tylenol  She does seem to be depressed Hard to judge with the worsening dementia---no interest in things  Current Outpatient Medications on File Prior to Visit  Medication Sig Dispense Refill  . ergocalciferol (VITAMIN D2) 1.25 MG (50000 UT) capsule Take 50,000 Units by mouth every 30 (thirty) days.    Marland Kitchen ibandronate (BONIVA) 150 MG tablet TAKE 1 TABLET BY MOUTH EVERY 30 DAYS. TAKE IN THE MORNING  WITH A FULLGLASS OFWATER & ON AN EMPTY STOMACH 1 tablet 11  . Multiple Vitamins-Minerals (MULTIVITAMIN ADULT PO) Take 1 tablet by mouth daily.     No current facility-administered medications on file prior to visit.    Allergies  Allergen Reactions  . Sulfa Antibiotics Hives, Itching, Rash and Other (See Comments)    Other Reaction: Other reaction Other Reaction: Other reaction Other reaction(s): Other (See Comments) Other Reaction: Other reaction    Past Medical History:  Diagnosis Date  . Cancer ([place])    squamous cell skin ca  . Dementia ([place])   . Osteoporosis, senile   . Patella fracture    Left 2014, bilateral 9/15  . Spastic dysphonia     Past Surgical History:  Procedure Laterality Date  . ROTATOR CUFF REPAIR Left   . SKIN CANCER EXCISION     SCC on scalp  . VAGINAL HYSTERECTOMY      Family History  Problem Relation Age of Onset  . Dementia Brother     Social History   Socioeconomic History  . Marital status: Married    Spouse name: Not on file  . Number of children: 4  . Years of education: Not on file  . Highest education level: Not on file  Occupational History  . Occupation: Electronics engineer    Comment: Retired  Tobacco Use  . Smoking status: Never Smoker  . Smokeless tobacco: Never Used  Substance and Sexual Activity  . Alcohol use: No    Alcohol/week: 0.0 standard drinks  . Drug use: No  .  Sexual activity: Never  Other Topics Concern  . Not on file  Social History Narrative   No living will   Requests husband as health care POA   Would accept resuscitation attempts   Not sure about tube feeds   Social Determinants of Health   Financial Resource Strain:   . Difficulty of Paying Living Expenses: Not on file  Food Insecurity:   . Worried About Charity fundraiser in the Last Year: Not on file  . Ran Out of Food in the Last Year: Not on file  Transportation Needs:   . Lack of Transportation (Medical): Not on file  .  Lack of Transportation (Non-Medical): Not on file  Physical Activity:   . Days of Exercise per Week: Not on file  . Minutes of Exercise per Session: Not on file  Stress:   . Feeling of Stress : Not on file  Social Connections:   . Frequency of Communication with Friends and Family: Not on file  . Frequency of Social Gatherings with Friends and Family: Not on file  . Attends Religious Services: Not on file  . Active Member of Clubs or Organizations: Not on file  . Attends Archivist Meetings: Not on file  . Marital Status: Not on file  Intimate Partner Violence:   . Fear of Current or Ex-Partner: Not on file  . Emotionally Abused: Not on file  . Physically Abused: Not on file  . Sexually Abused: Not on file   Review of Systems Appetite seems fair--but weight down more She resists getting her teeth brushed. No dentist visits Had to cancel dermatologist appt---not really able to tolerate the visit Will wear seat belt Bowels can be slow--- he gives her miralax prn or biscodyl prn No SOB No apparent chest pain Snores at night and has some brief apnea    Objective:   Physical Exam  Constitutional: No distress.  cachectic  Neck: No thyromegaly present.  Cardiovascular: Normal rate, regular rhythm and normal heart sounds. Exam reveals no gallop.  No murmur heard. Respiratory: Effort normal and breath sounds normal. No respiratory distress. She has no wheezes. She has no rales.  GI: Soft. There is no abdominal tenderness.  Musculoskeletal:        General: No tenderness or edema.  Lymphadenopathy:    She has no cervical adenopathy.  Neurological:  Passive At most mumbles a few words Doesn't really engage much  Skin: No rash noted. No erythema.  Psychiatric:  Psychomotor retardation Hard to judge mood           Assessment & Plan:

## 2019-11-13 ENCOUNTER — Other Ambulatory Visit: Payer: Self-pay | Admitting: Internal Medicine

## 2019-11-13 ENCOUNTER — Ambulatory Visit: Payer: Medicare Other | Admitting: Podiatry

## 2019-11-13 LAB — T4, FREE: Free T4: 1.07 ng/dL (ref 0.60–1.60)

## 2019-11-13 LAB — CBC
HCT: 34.9 % — ABNORMAL LOW (ref 36.0–46.0)
Hemoglobin: 11.8 g/dL — ABNORMAL LOW (ref 12.0–15.0)
MCHC: 33.9 g/dL (ref 30.0–36.0)
MCV: 100.7 fl — ABNORMAL HIGH (ref 78.0–100.0)
Platelets: 233 10*3/uL (ref 150.0–400.0)
RBC: 3.46 Mil/uL — ABNORMAL LOW (ref 3.87–5.11)
RDW: 14.4 % (ref 11.5–15.5)
WBC: 5.6 10*3/uL (ref 4.0–10.5)

## 2019-11-13 LAB — COMPREHENSIVE METABOLIC PANEL
ALT: 16 U/L (ref 0–35)
AST: 28 U/L (ref 0–37)
Albumin: 4.8 g/dL (ref 3.5–5.2)
Alkaline Phosphatase: 70 U/L (ref 39–117)
BUN: 21 mg/dL (ref 6–23)
CO2: 27 mEq/L (ref 19–32)
Calcium: 10.3 mg/dL (ref 8.4–10.5)
Chloride: 103 mEq/L (ref 96–112)
Creatinine, Ser: 0.88 mg/dL (ref 0.40–1.20)
GFR: 60.91 mL/min (ref >60.00)
Glucose, Bld: 86 mg/dL (ref 70–99)
Potassium: 4.4 mEq/L (ref 3.5–5.1)
Sodium: 139 mEq/L (ref 135–145)
Total Bilirubin: 0.6 mg/dL (ref 0.2–1.2)
Total Protein: 7.8 g/dL (ref 6.0–8.3)

## 2019-11-15 ENCOUNTER — Telehealth: Payer: Self-pay | Admitting: Internal Medicine

## 2019-11-15 NOTE — Telephone Encounter (Signed)
I think it would be unusual for this to happen so quick. Best to hold it tonight though, just in case You can take melatonin with mirtazapine--but I wouldn't at first (until she gets used to it--if she can take it)

## 2019-11-15 NOTE — Telephone Encounter (Signed)
Spoke with patient husband Kathryn Hodges Patient started Remeron on Tuesday this week and has taken 2 pills. Pt started having diarrhea (side effect?) today. CNA was at the house and witnessed her having this this afternoon - abnormal for the patient.  Phillip(husband) is not going to give a tablet tonight given her sx's -- would this medication cause s.e. that quick?  Also patient used to take Melatonin PRN -- should she stop this while taking Remeron?  Please advise, thanks.

## 2019-11-15 NOTE — Telephone Encounter (Signed)
Spoke to pt's husband. He will hold it for a day or 2. If she is still having an issue, he will know it was not the issue. He will hold the melatonin for now.

## 2019-11-16 NOTE — Telephone Encounter (Signed)
okay

## 2019-11-22 ENCOUNTER — Telehealth: Payer: Self-pay

## 2019-11-22 NOTE — Telephone Encounter (Signed)
Spoke with patient's husband and advised of everything. There is a CNA there now helping patient. Kathryn Hodges will keep an eye on symptoms and he was advised to try and contact the nurse at [facility] also just in case. Kathryn Hodges was advised if her symptoms did not improve or got worse to have patient evaluated right away.

## 2019-11-22 NOTE — Telephone Encounter (Signed)
pts husband said for the last 2 nights pt has been agitated and somewhat confused; last night was worse; pt was up from 2 AM - 4;30 AM. Pt initially had to urinate but when pt got into bathroom would not take her bottoms down and would not allow her husband to take her bottoms down and finally after about 1 hr Mr Pough forced without harming pt to go back to bed; Mr Tiedeman said for another hour or so he had to hold pt in the bed by the waistband of her pjs. Pt was very uncooperative. Mr Frydman wants to know if Mirtazapine 15 mg cause pt to act this way. Pt is sleeping and resting now. [facility]. Mr Shupp does not want to go thru another night like last night. Advised that Dr Silvio Pate is out of office but [place] CMA advised to send message to Dr Silvio Pate in case in might see note and send to provider in office. ED precautions given and Mr Zich voiced understanding. Mr Ramdass said he would try to talk with one of the [place] if does not hear back today from a doctor. Presently lives in independent living at [facility].

## 2019-11-22 NOTE — Telephone Encounter (Signed)
Agitation and confusion can be a side effect of the medication.   Could hold medication (appears to have been started for appetite/depression) and see if symptoms improve.   Also worry about worsening dementia or an infection.   If symptoms continue off medication or even now may need to be evaluated -- for possible infectious cause.

## 2019-11-25 NOTE — Telephone Encounter (Signed)
Please check on her again She does not seem to have tolerated the mirtazapine---he should definitely stop that. Find out if we need to do more (like give a prn tranquilizer)

## 2019-11-26 NOTE — Telephone Encounter (Signed)
Left message on VM per DPR. Will wait for him to respond before closing the note.

## 2019-11-27 NOTE — Telephone Encounter (Signed)
Left message on VM per DPR. Advised him that Dr Silvio Pate was out of the office and may or may not be looking at his messages before we leave for the holiday on Thursday.   I do not want to send this to a different provider since Dr Silvio Pate is very aware of her history and may have a plan in mind for her already.

## 2019-11-27 NOTE — Telephone Encounter (Signed)
I tried to call back, but the line was busy. Dr Silvio Pate is out of the office the rest of the week. He may or may not see this message before we close for the holiday.

## 2019-11-27 NOTE — Telephone Encounter (Signed)
Patient's husband returned Shannon's phone call and he states he would like to try a PRN tranquilizer for patient, to see if this will help her agitation/mood, and confusion. He would like this sent into [facility]. Thank you!

## 2019-11-29 MED ORDER — ALPRAZOLAM 0.25 MG PO TABS: 0.2500 mg | ORAL_TABLET | Freq: Three times a day (TID) | ORAL | 0 refills | Status: DC | PRN

## 2019-11-29 NOTE — Addendum Note (Signed)
Addended by: Viviana Simpler I on: 11/29/2019 10:49 AM   Modules accepted: Orders

## 2019-11-29 NOTE — Telephone Encounter (Signed)
Spoke to pt's husband. He said he has been giving her melatonin. He will stop that.

## 2019-11-29 NOTE — Telephone Encounter (Signed)
Please let him know I sent the Rx for the tranquilizer for him to try

## 2019-12-03 ENCOUNTER — Telehealth: Payer: Self-pay | Admitting: Internal Medicine

## 2019-12-03 NOTE — Telephone Encounter (Signed)
Spoke to pt's husband. He will try 1/2 tablet.  He is asking about HCPOA. He and the family have discussed her health situation and said they want to DNR. He does not know what to do since she is not really able to make her own decisions now.

## 2019-12-03 NOTE — Telephone Encounter (Signed)
Let him know that it is not unusual for the alprazolam to cause sedation and the reaction he observed. They can try cutting it in half--that may help calm her without as much sedation. May want to try using it mostly close to bedtime (if the half dose still knocks her out)

## 2019-12-03 NOTE — Telephone Encounter (Signed)
Patient's Husband called He stated that the patient was prescribed ALPRAZolam Duanne Moron)  He stated that on Saturday they gave her a dose at 12:30. By 3:30pm he stated the patient was almost mummy like. She was very sleepy and it was hard to get her off the couch. Husband stated that his daughter came in and was able to help get her moved to the table to eat. He stated that while eating the patient didn't really know she was eating. She was reaching out in the air like she was taking food but was not grabbing anything. He stated that the patient acted happy and go lucky but is concerned about how she went to a mummy like stage and very sleepy        Patient's Husband also wanted advice on how he could get power to make the medical decision for the patient because the patient is not in the right state of mind to make any for herself.

## 2019-12-03 NOTE — Telephone Encounter (Signed)
He is the [place] as her spouse--since she does not have capacity I have filled out a DNR for her---please mail it to him

## 2019-12-04 NOTE — Telephone Encounter (Signed)
Spoke to pt's husband. I made a copy of the DNR and mailed him the original. I advised him to display it where EMS/Emergency Care could see it.

## 2019-12-17 ENCOUNTER — Telehealth: Payer: Self-pay | Admitting: *Deleted

## 2019-12-17 DIAGNOSIS — Z23 Encounter for immunization: Secondary | ICD-10-CM | POA: Diagnosis not present

## 2019-12-17 NOTE — Telephone Encounter (Signed)
Per DPR, left detail message of Dr Alla German comments for patient.

## 2019-12-17 NOTE — Telephone Encounter (Signed)
Patient's husband left a voicemail stating that they both are scheduled to have the covid vaccine today at 3:00. Kathryn Hodges wanted to know if Dr. Silvio Pate thinks that he should give his wife a little tylenol before they go to get the vaccine?

## 2019-12-17 NOTE — Telephone Encounter (Signed)
I don't think that is necessary ---but it wouldn't hurt (650mg )

## 2019-12-24 ENCOUNTER — Telehealth: Payer: Self-pay

## 2019-12-24 NOTE — Telephone Encounter (Signed)
Denise with Palliative care & [facility] left v/m that pts husband request palliative care services for pt; pt lives at [facility]. Is Dr Silvio Pate OK with this. Denise request cb. Will send to Dr Silvio Pate who is out of office today and Avie Echevaria NP who is in office.

## 2019-12-24 NOTE — Telephone Encounter (Signed)
This can wait until his return tomorrow.

## 2019-12-24 NOTE — Telephone Encounter (Signed)
That is fine I approve of palliative services for her

## 2019-12-25 NOTE — Telephone Encounter (Signed)
Denise with palliative services with [facility] left v/m requesting cb if Dr Silvio Pate is in agreement with pt having palliative care services.

## 2019-12-25 NOTE — Telephone Encounter (Signed)
Unable to reach anyone at number provided. LVM

## 2019-12-25 NOTE — Telephone Encounter (Signed)
Noted  

## 2019-12-26 NOTE — Telephone Encounter (Signed)
Unable to reach, had to leave a vm as well

## 2019-12-27 ENCOUNTER — Telehealth: Payer: Self-pay | Admitting: Internal Medicine

## 2019-12-27 NOTE — Telephone Encounter (Signed)
Noted will close this phone note

## 2019-12-27 NOTE — Telephone Encounter (Signed)
Please see other telephone encounter.

## 2019-12-27 NOTE — Telephone Encounter (Signed)
Langley Gauss with [facility] calling to let [place] [place] know that the Palliative Care orders were received.  And they are going to proceed with services.   If there are any questions please call [phone]

## 2019-12-28 ENCOUNTER — Telehealth: Payer: Self-pay

## 2019-12-28 NOTE — Telephone Encounter (Signed)
Telephone call to patient to schedule palliative care visit with patient. Patient/family in agreement with home visit on 12-31-19 at 1:30PM.

## 2019-12-31 ENCOUNTER — Other Ambulatory Visit: Payer: Medicare Other

## 2019-12-31 ENCOUNTER — Other Ambulatory Visit: Payer: Self-pay

## 2019-12-31 ENCOUNTER — Telehealth: Payer: Self-pay | Admitting: *Deleted

## 2019-12-31 DIAGNOSIS — Z515 Encounter for palliative care: Secondary | ICD-10-CM

## 2019-12-31 NOTE — Telephone Encounter (Signed)
I am fine with that if they think she is hospice eligible

## 2019-12-31 NOTE — Progress Notes (Signed)
COMMUNITY PALLIATIVE CARE SW NOTE  PATIENT NAME: Kathryn Hodges DOB: [birth year] MRN: [id number]  PRIMARY CARE PROVIDER: Venia Carbon, MD  RESPONSIBLE PARTY:  Acct ID - Guarantor Home Phone Work Phone Relationship Acct Type  [id number] Prince [phone]  Self P/F     [id number], [place], [place] [address]     PLAN OF CARE and INTERVENTIONS:             1. GOALS OF CARE/ ADVANCE CARE PLANNING:  Patient is a DNR, form is in the home. Goal is for patient to be as comfortable as possible.  2. SOCIAL/EMOTIONAL/SPIRITUAL ASSESSMENT/ INTERVENTIONS:  SW and RN met patient and Doren Custard (patient's husband) in the home. Cordie (patient's aide) was present in the home. Patient is in independent living at [facility]. Doren Custard provided brief medical history. Patient has a history of falls, most recent noted in October. Patient denies pain, Doren Custard said patient will not complain but mentions pain in her back occasionally. Patient's appetite is fair. Patient is sleeping "pretty good". Doren Custard said that when patient is unable to sleep, she is restless and talking throughout the night. Doren Custard gives patient melatonin PRN. Patient and Doren Custard have been married for over 81 years. Patient has four children, and 10 grandchildren. Patient's children are located throughout [place] in [place], [place] and [place]. Patient has one son in [place]. Patient worked in religious history and education, and also worked for various churches in many roles. Patient is less engaged but used to enjoy puzzles, reading and walking with Doren Custard. SW discussed palliative and hospice services, provided emotional support, used active and reflective listening and discussed safety precautions and goals.  3. PATIENT/CAREGIVER EDUCATION/ COPING:  Patient was alert, calm during visit. Patient tries to communicate with team but has difficulty. Doren Custard noted periods of agitation, discussed coping and calming techniques.  Doren Custard said he tries to be patient and reassure. Doren Custard expresses feelings openly, and is supportive of care for patient. Family is supportive.  4. PERSONAL EMERGENCY PLAN:  Family/caregiver will call 9-1-1 for emergencies. Patient has an alarm in her room to alert Doren Custard if she gets up without assistance.  5. COMMUNITY RESOURCES COORDINATION/ HEALTH CARE NAVIGATION:  Doren Custard coordinates patient care. Patient has private aides with [facility] that come 3 days a week for four hours, and then 3 nights a week from [facility]. An aide from [facility] comes twice a week.  6. FINANCIAL/LEGAL CONCERNS/INTERVENTIONS:  None.     SOCIAL HX:  Social History   Tobacco Use  . Smoking status: Never Smoker  . Smokeless tobacco: Never Used  Substance Use Topics  . Alcohol use: No    Alcohol/week: 0.0 standard drinks    CODE STATUS:   Code Status: Not on file (DNR) ADVANCED DIRECTIVES: N MOST FORM COMPLETE:  No. HOSPICE EDUCATION PROVIDED: Yes - referring for hospice evaluation, RN to consult PCP and medical director.  PPS: Patient is dependent of ADLs. Patient can walk with walker and heavy assist.  I spent 75 minutes with patient/family, from 1:30-2:45p providing education, support and consultation.   Margaretmary Lombard, LCSW

## 2019-12-31 NOTE — Telephone Encounter (Signed)
Spoke with Kathryn Hodges and made her aware of the verbal ok per Dr. Silvio Pate. She had no additional questions at this time.

## 2019-12-31 NOTE — Progress Notes (Cosign Needed)
PATIENT NAME: Kathryn Hodges DOB: 21-Jun-1931 MRN: [id number]  PRIMARY CARE PROVIDER: Venia Carbon, MD  RESPONSIBLE PARTY:  Acct ID - Guarantor Home Phone Work Phone Relationship Acct Type  [id number] Prince [id number]  Self P/F     [address], [place], [place] [address]    PLAN OF CARE and INTERVENTIONS:               1.  GOALS OF CARE/ ADVANCE CARE PLANNING:  Husband would like for patient to be comfortable to for patient to gain weight.               2.  PATIENT/CAREGIVER EDUCATION:  Education on fall precautions, education on disease progression, review meds, education on difference between palliative care verses hospice care.                3.  DISEASE STATUS:  SW and RN made scheduled palliative home care visit. Palliative care team met with patient, patients husband Doren Custard and hired caregiver Cordie. Caregiver just completed giving patient a bath. Patient's medical history includes Alzheimer's dementia, spastic dysphonia, osteoporosis, chronic cystitis, malnutrition of moderate degree, history of back pain and mood disorder. Patient is 5'2 and current weight is 67 lb with clothes and house slippers on. Patient's current BMI is 12.6. Patient was given mirtazapine to improve appetite however husband reports Mirtazapine and Xanax caused patient to be worse. Caregiver reports patient may eat 3-4 spoonsfuls of food at lunch time. Patient is only drinking a small amount of fluids each day.   Patient suffered 2 hard falls in the past 6 months per husband. Patient has difficulty speaking due to spastic dysphonia and will yell out and cry when caregiver attempts to provide care to patient. Patient having more difficulty ambulating and cannot stand without assist.  Patient takes melatonin for PRN use as well as Miralax powder. Patient having increased periods of agitation. Husband has arranged to have caregivers in the home 3 nights a week from 10 p.m. to 8 am. Cordie is in the home t 3  days a week for 5 hours.   Patient's breath sounds are clear no shortness of breath or cough observed in patient. Patient does suffer with constipation. Patient has no edema in her lower extremities. Patient and wife have 4 children and children are supportive.  Husband is a retired Company secretary and his worked with as a Veterinary surgeon in the past. Nurse feels like patient is making changes and would qualify for hospice care. Nurse placedand call to doctor Letvak's  office and requested order for  hospice evaluation for patient. Nurse will also contact Dr. Clifton James and get confirmation for a hospice referral. Support provided to husband in managing patients care. Husband and caregiver encouraged to contact palliative care with questions or concerns.   4:55 PM Jasmine called from Dr Alla German office and states Dr Silvio Pate is in agreement with hospice evaluation for patient.     HISTORY OF PRESENT ILLNESS: Patient is 86 year old female who resides in [place] apartment with her husband.  Husband open to hospice evaluation for patient.  Patient has caregivers in home throughout the day.  RN contacted Dr Alla German office to request hospice evaluation for patient.   CODE STATUS: DNR  ADVANCED DIRECTIVES: No MOST FORM: No PPS: 30%   PHYSICAL EXAM:   VITALS: Today's Vitals   12/31/19 1410  BP: 98/76  Pulse: 82  Resp: 16  Temp: 98 F (36.7 C)  TempSrc: Temporal  SpO2: 97%  Weight: 67 lb (30.4 kg)  Height: '5\' 2"'$  (1.575 m)  PainSc: 0-No pain    LUNGS: clear to asculation CARDIAC: Cor RRR  EXTREMITIES: none edema SKIN: sacral area is red per Caregiver Cordie  NEURO: positive for gait problems, memory problems, speech problems and weakness       Nilda Simmer, RN

## 2019-12-31 NOTE — Telephone Encounter (Signed)
Virgie Therapist, sports with [facility] left a voicemail stating that she is asking for a Hospice Evaulation. Virgie  statied that she checked the patient's weight this morning dressed and it was 67 lbs. Virgie stated that she knows that the patient has never been a big lady. Virgie stated that she is not eating as much and having difficulty ambulating.

## 2020-01-03 DIAGNOSIS — Z87311 Personal history of (healed) other pathological fracture: Secondary | ICD-10-CM | POA: Diagnosis not present

## 2020-01-03 DIAGNOSIS — F0280 Dementia in other diseases classified elsewhere without behavioral disturbance: Secondary | ICD-10-CM | POA: Diagnosis not present

## 2020-01-03 DIAGNOSIS — R296 Repeated falls: Secondary | ICD-10-CM | POA: Diagnosis not present

## 2020-01-03 DIAGNOSIS — G249 Dystonia, unspecified: Secondary | ICD-10-CM | POA: Diagnosis not present

## 2020-01-03 DIAGNOSIS — M80011D Age-related osteoporosis with current pathological fracture, right shoulder, subsequent encounter for fracture with routine healing: Secondary | ICD-10-CM | POA: Diagnosis not present

## 2020-01-03 DIAGNOSIS — Z87448 Personal history of other diseases of urinary system: Secondary | ICD-10-CM | POA: Diagnosis not present

## 2020-01-03 DIAGNOSIS — R627 Adult failure to thrive: Secondary | ICD-10-CM | POA: Diagnosis not present

## 2020-01-03 DIAGNOSIS — G309 Alzheimer's disease, unspecified: Secondary | ICD-10-CM | POA: Diagnosis not present

## 2020-01-03 DIAGNOSIS — R634 Abnormal weight loss: Secondary | ICD-10-CM | POA: Diagnosis not present

## 2020-01-03 DIAGNOSIS — E559 Vitamin D deficiency, unspecified: Secondary | ICD-10-CM | POA: Diagnosis not present

## 2020-01-04 DIAGNOSIS — R296 Repeated falls: Secondary | ICD-10-CM | POA: Diagnosis not present

## 2020-01-04 DIAGNOSIS — R634 Abnormal weight loss: Secondary | ICD-10-CM | POA: Diagnosis not present

## 2020-01-04 DIAGNOSIS — M80011D Age-related osteoporosis with current pathological fracture, right shoulder, subsequent encounter for fracture with routine healing: Secondary | ICD-10-CM | POA: Diagnosis not present

## 2020-01-04 DIAGNOSIS — G249 Dystonia, unspecified: Secondary | ICD-10-CM | POA: Diagnosis not present

## 2020-01-04 DIAGNOSIS — F0280 Dementia in other diseases classified elsewhere without behavioral disturbance: Secondary | ICD-10-CM | POA: Diagnosis not present

## 2020-01-04 DIAGNOSIS — G309 Alzheimer's disease, unspecified: Secondary | ICD-10-CM | POA: Diagnosis not present

## 2020-01-05 DIAGNOSIS — M80011D Age-related osteoporosis with current pathological fracture, right shoulder, subsequent encounter for fracture with routine healing: Secondary | ICD-10-CM | POA: Diagnosis not present

## 2020-01-05 DIAGNOSIS — R296 Repeated falls: Secondary | ICD-10-CM | POA: Diagnosis not present

## 2020-01-05 DIAGNOSIS — F0280 Dementia in other diseases classified elsewhere without behavioral disturbance: Secondary | ICD-10-CM | POA: Diagnosis not present

## 2020-01-05 DIAGNOSIS — R634 Abnormal weight loss: Secondary | ICD-10-CM | POA: Diagnosis not present

## 2020-01-05 DIAGNOSIS — G309 Alzheimer's disease, unspecified: Secondary | ICD-10-CM | POA: Diagnosis not present

## 2020-01-05 DIAGNOSIS — G249 Dystonia, unspecified: Secondary | ICD-10-CM | POA: Diagnosis not present

## 2020-01-08 DIAGNOSIS — F0280 Dementia in other diseases classified elsewhere without behavioral disturbance: Secondary | ICD-10-CM | POA: Diagnosis not present

## 2020-01-08 DIAGNOSIS — R634 Abnormal weight loss: Secondary | ICD-10-CM | POA: Diagnosis not present

## 2020-01-08 DIAGNOSIS — G249 Dystonia, unspecified: Secondary | ICD-10-CM | POA: Diagnosis not present

## 2020-01-08 DIAGNOSIS — M80011D Age-related osteoporosis with current pathological fracture, right shoulder, subsequent encounter for fracture with routine healing: Secondary | ICD-10-CM | POA: Diagnosis not present

## 2020-01-08 DIAGNOSIS — R296 Repeated falls: Secondary | ICD-10-CM | POA: Diagnosis not present

## 2020-01-08 DIAGNOSIS — G309 Alzheimer's disease, unspecified: Secondary | ICD-10-CM | POA: Diagnosis not present

## 2020-01-10 ENCOUNTER — Telehealth: Payer: Self-pay | Admitting: Internal Medicine

## 2020-01-10 DIAGNOSIS — G249 Dystonia, unspecified: Secondary | ICD-10-CM | POA: Diagnosis not present

## 2020-01-10 DIAGNOSIS — G309 Alzheimer's disease, unspecified: Secondary | ICD-10-CM | POA: Diagnosis not present

## 2020-01-10 DIAGNOSIS — M80011D Age-related osteoporosis with current pathological fracture, right shoulder, subsequent encounter for fracture with routine healing: Secondary | ICD-10-CM | POA: Diagnosis not present

## 2020-01-10 DIAGNOSIS — R634 Abnormal weight loss: Secondary | ICD-10-CM | POA: Diagnosis not present

## 2020-01-10 DIAGNOSIS — R296 Repeated falls: Secondary | ICD-10-CM | POA: Diagnosis not present

## 2020-01-10 DIAGNOSIS — F0280 Dementia in other diseases classified elsewhere without behavioral disturbance: Secondary | ICD-10-CM | POA: Diagnosis not present

## 2020-01-10 NOTE — Telephone Encounter (Signed)
I do recommend the second vaccine but I understand if he holds off The most part of the immunity is from the first dose--so I am glad she at least got that one  Most people are not having any major issues, even with the second vaccine

## 2020-01-10 NOTE — Telephone Encounter (Signed)
Tried to call but line was busy. Will call in the morning.

## 2020-01-10 NOTE — Telephone Encounter (Signed)
Kathryn Hodges calling for recommendations.  Pt is supposed to be getting her second Covid vaccine on Monday 2/15 and the husband states that he has some reservations about her getting this 2nd vaccine. Patient was just admitted to Hospice over the last week and is very frail. He states that she had no problem with the 1st vaccine but he is concerned she might have a reaction or issues with the second one. Pt had similar issues with the Shingrix vaccine - 1st dose did not bother her but she reacted to the 2nd dose.   Please advise, thanks.

## 2020-01-11 NOTE — Telephone Encounter (Signed)
Left detailed message on VM per DPR. 

## 2020-01-15 DIAGNOSIS — F0280 Dementia in other diseases classified elsewhere without behavioral disturbance: Secondary | ICD-10-CM | POA: Diagnosis not present

## 2020-01-15 DIAGNOSIS — G309 Alzheimer's disease, unspecified: Secondary | ICD-10-CM | POA: Diagnosis not present

## 2020-01-15 DIAGNOSIS — R634 Abnormal weight loss: Secondary | ICD-10-CM | POA: Diagnosis not present

## 2020-01-15 DIAGNOSIS — G249 Dystonia, unspecified: Secondary | ICD-10-CM | POA: Diagnosis not present

## 2020-01-15 DIAGNOSIS — M80011D Age-related osteoporosis with current pathological fracture, right shoulder, subsequent encounter for fracture with routine healing: Secondary | ICD-10-CM | POA: Diagnosis not present

## 2020-01-15 DIAGNOSIS — R296 Repeated falls: Secondary | ICD-10-CM | POA: Diagnosis not present

## 2020-01-21 DIAGNOSIS — F0280 Dementia in other diseases classified elsewhere without behavioral disturbance: Secondary | ICD-10-CM | POA: Diagnosis not present

## 2020-01-21 DIAGNOSIS — G249 Dystonia, unspecified: Secondary | ICD-10-CM | POA: Diagnosis not present

## 2020-01-21 DIAGNOSIS — G309 Alzheimer's disease, unspecified: Secondary | ICD-10-CM | POA: Diagnosis not present

## 2020-01-21 DIAGNOSIS — R296 Repeated falls: Secondary | ICD-10-CM | POA: Diagnosis not present

## 2020-01-21 DIAGNOSIS — M80011D Age-related osteoporosis with current pathological fracture, right shoulder, subsequent encounter for fracture with routine healing: Secondary | ICD-10-CM | POA: Diagnosis not present

## 2020-01-21 DIAGNOSIS — R634 Abnormal weight loss: Secondary | ICD-10-CM | POA: Diagnosis not present

## 2020-01-22 DIAGNOSIS — R296 Repeated falls: Secondary | ICD-10-CM | POA: Diagnosis not present

## 2020-01-22 DIAGNOSIS — G309 Alzheimer's disease, unspecified: Secondary | ICD-10-CM | POA: Diagnosis not present

## 2020-01-22 DIAGNOSIS — G249 Dystonia, unspecified: Secondary | ICD-10-CM | POA: Diagnosis not present

## 2020-01-22 DIAGNOSIS — R634 Abnormal weight loss: Secondary | ICD-10-CM | POA: Diagnosis not present

## 2020-01-22 DIAGNOSIS — M80011D Age-related osteoporosis with current pathological fracture, right shoulder, subsequent encounter for fracture with routine healing: Secondary | ICD-10-CM | POA: Diagnosis not present

## 2020-01-22 DIAGNOSIS — F0280 Dementia in other diseases classified elsewhere without behavioral disturbance: Secondary | ICD-10-CM | POA: Diagnosis not present

## 2020-01-25 DIAGNOSIS — M80011D Age-related osteoporosis with current pathological fracture, right shoulder, subsequent encounter for fracture with routine healing: Secondary | ICD-10-CM | POA: Diagnosis not present

## 2020-01-25 DIAGNOSIS — F0280 Dementia in other diseases classified elsewhere without behavioral disturbance: Secondary | ICD-10-CM | POA: Diagnosis not present

## 2020-01-25 DIAGNOSIS — R634 Abnormal weight loss: Secondary | ICD-10-CM | POA: Diagnosis not present

## 2020-01-25 DIAGNOSIS — G249 Dystonia, unspecified: Secondary | ICD-10-CM | POA: Diagnosis not present

## 2020-01-25 DIAGNOSIS — G309 Alzheimer's disease, unspecified: Secondary | ICD-10-CM | POA: Diagnosis not present

## 2020-01-25 DIAGNOSIS — R296 Repeated falls: Secondary | ICD-10-CM | POA: Diagnosis not present

## 2020-01-28 DIAGNOSIS — R627 Adult failure to thrive: Secondary | ICD-10-CM | POA: Diagnosis not present

## 2020-01-28 DIAGNOSIS — G309 Alzheimer's disease, unspecified: Secondary | ICD-10-CM | POA: Diagnosis not present

## 2020-01-28 DIAGNOSIS — F0280 Dementia in other diseases classified elsewhere without behavioral disturbance: Secondary | ICD-10-CM | POA: Diagnosis not present

## 2020-01-28 DIAGNOSIS — G249 Dystonia, unspecified: Secondary | ICD-10-CM | POA: Diagnosis not present

## 2020-01-28 DIAGNOSIS — R634 Abnormal weight loss: Secondary | ICD-10-CM | POA: Diagnosis not present

## 2020-01-28 DIAGNOSIS — Z87448 Personal history of other diseases of urinary system: Secondary | ICD-10-CM | POA: Diagnosis not present

## 2020-01-28 DIAGNOSIS — M80011D Age-related osteoporosis with current pathological fracture, right shoulder, subsequent encounter for fracture with routine healing: Secondary | ICD-10-CM | POA: Diagnosis not present

## 2020-01-28 DIAGNOSIS — Z87311 Personal history of (healed) other pathological fracture: Secondary | ICD-10-CM | POA: Diagnosis not present

## 2020-01-28 DIAGNOSIS — E559 Vitamin D deficiency, unspecified: Secondary | ICD-10-CM | POA: Diagnosis not present

## 2020-01-28 DIAGNOSIS — R296 Repeated falls: Secondary | ICD-10-CM | POA: Diagnosis not present

## 2020-01-29 DIAGNOSIS — R296 Repeated falls: Secondary | ICD-10-CM | POA: Diagnosis not present

## 2020-01-29 DIAGNOSIS — G309 Alzheimer's disease, unspecified: Secondary | ICD-10-CM | POA: Diagnosis not present

## 2020-01-29 DIAGNOSIS — M80011D Age-related osteoporosis with current pathological fracture, right shoulder, subsequent encounter for fracture with routine healing: Secondary | ICD-10-CM | POA: Diagnosis not present

## 2020-01-29 DIAGNOSIS — G249 Dystonia, unspecified: Secondary | ICD-10-CM | POA: Diagnosis not present

## 2020-01-29 DIAGNOSIS — F0280 Dementia in other diseases classified elsewhere without behavioral disturbance: Secondary | ICD-10-CM | POA: Diagnosis not present

## 2020-01-29 DIAGNOSIS — R634 Abnormal weight loss: Secondary | ICD-10-CM | POA: Diagnosis not present

## 2020-02-01 DIAGNOSIS — G249 Dystonia, unspecified: Secondary | ICD-10-CM | POA: Diagnosis not present

## 2020-02-01 DIAGNOSIS — R296 Repeated falls: Secondary | ICD-10-CM | POA: Diagnosis not present

## 2020-02-01 DIAGNOSIS — M80011D Age-related osteoporosis with current pathological fracture, right shoulder, subsequent encounter for fracture with routine healing: Secondary | ICD-10-CM | POA: Diagnosis not present

## 2020-02-01 DIAGNOSIS — R634 Abnormal weight loss: Secondary | ICD-10-CM | POA: Diagnosis not present

## 2020-02-01 DIAGNOSIS — G309 Alzheimer's disease, unspecified: Secondary | ICD-10-CM | POA: Diagnosis not present

## 2020-02-01 DIAGNOSIS — F0280 Dementia in other diseases classified elsewhere without behavioral disturbance: Secondary | ICD-10-CM | POA: Diagnosis not present

## 2020-02-05 ENCOUNTER — Encounter: Payer: Self-pay | Admitting: Podiatry

## 2020-02-05 ENCOUNTER — Other Ambulatory Visit: Payer: Self-pay

## 2020-02-05 ENCOUNTER — Ambulatory Visit (INDEPENDENT_AMBULATORY_CARE_PROVIDER_SITE_OTHER): Payer: Medicare Other | Admitting: Podiatry

## 2020-02-05 DIAGNOSIS — B351 Tinea unguium: Secondary | ICD-10-CM | POA: Diagnosis not present

## 2020-02-05 DIAGNOSIS — M79676 Pain in unspecified toe(s): Secondary | ICD-10-CM | POA: Diagnosis not present

## 2020-02-05 DIAGNOSIS — L989 Disorder of the skin and subcutaneous tissue, unspecified: Secondary | ICD-10-CM

## 2020-02-06 DIAGNOSIS — G309 Alzheimer's disease, unspecified: Secondary | ICD-10-CM | POA: Diagnosis not present

## 2020-02-06 DIAGNOSIS — G249 Dystonia, unspecified: Secondary | ICD-10-CM | POA: Diagnosis not present

## 2020-02-06 DIAGNOSIS — R634 Abnormal weight loss: Secondary | ICD-10-CM | POA: Diagnosis not present

## 2020-02-06 DIAGNOSIS — M80011D Age-related osteoporosis with current pathological fracture, right shoulder, subsequent encounter for fracture with routine healing: Secondary | ICD-10-CM | POA: Diagnosis not present

## 2020-02-06 DIAGNOSIS — F0280 Dementia in other diseases classified elsewhere without behavioral disturbance: Secondary | ICD-10-CM | POA: Diagnosis not present

## 2020-02-06 DIAGNOSIS — R296 Repeated falls: Secondary | ICD-10-CM | POA: Diagnosis not present

## 2020-02-08 DIAGNOSIS — R634 Abnormal weight loss: Secondary | ICD-10-CM | POA: Diagnosis not present

## 2020-02-08 DIAGNOSIS — R296 Repeated falls: Secondary | ICD-10-CM | POA: Diagnosis not present

## 2020-02-08 DIAGNOSIS — F0280 Dementia in other diseases classified elsewhere without behavioral disturbance: Secondary | ICD-10-CM | POA: Diagnosis not present

## 2020-02-08 DIAGNOSIS — M80011D Age-related osteoporosis with current pathological fracture, right shoulder, subsequent encounter for fracture with routine healing: Secondary | ICD-10-CM | POA: Diagnosis not present

## 2020-02-08 DIAGNOSIS — G309 Alzheimer's disease, unspecified: Secondary | ICD-10-CM | POA: Diagnosis not present

## 2020-02-08 DIAGNOSIS — G249 Dystonia, unspecified: Secondary | ICD-10-CM | POA: Diagnosis not present

## 2020-02-08 NOTE — Progress Notes (Signed)
   SUBJECTIVE Patient presents to office today complaining of elongated, thickened nails that cause pain while ambulating in shoes. She is unable to trim her own nails. Patient is here for further evaluation and treatment.  Past Medical History:  Diagnosis Date  . Cancer ([place])    squamous cell skin ca  . Dementia ([place])   . Osteoporosis, senile   . Patella fracture    Left 2014, bilateral 9/15  . Spastic dysphonia     OBJECTIVE General Patient is awake, alert, and oriented x 3 and in no acute distress. Derm Skin is dry and supple bilateral. Negative open lesions or macerations. Remaining integument unremarkable. Nails are tender, long, thickened and dystrophic with subungual debris, consistent with onychomycosis, 1-5 bilateral. No signs of infection noted.  There is some hyperkeratotic callus tissue noted to the bilateral feet x2 Vasc  DP and PT pedal pulses palpable bilaterally. Temperature gradient within normal limits.  Neuro Epicritic and protective threshold sensation grossly intact bilaterally.  Musculoskeletal Exam No symptomatic pedal deformities noted bilateral. Muscular strength within normal limits.  ASSESSMENT 1. Onychodystrophic nails 1-5 bilateral with hyperkeratosis of nails.  2. Onychomycosis of nail due to dermatophyte bilateral 3. Pain in foot bilateral  PLAN OF CARE 1. Patient evaluated today.  2. Instructed to maintain good pedal hygiene and foot care.  3. Mechanical debridement of nails 1-5 bilaterally performed using a nail nipper. Filed with dremel without incident.  4. Excisional debridement of the hyperkeratotic callus lesions noted was performed using a tissue nipper without incident or bleeding.   5. Return to clinic in 3 mos.    Edrick Kins, DPM [facility]  Dr. Edrick Kins, [address]                                        [place], [place] [address]                Office [phone]  Fax [phone]

## 2020-02-11 DIAGNOSIS — G309 Alzheimer's disease, unspecified: Secondary | ICD-10-CM | POA: Diagnosis not present

## 2020-02-11 DIAGNOSIS — R296 Repeated falls: Secondary | ICD-10-CM | POA: Diagnosis not present

## 2020-02-11 DIAGNOSIS — R634 Abnormal weight loss: Secondary | ICD-10-CM | POA: Diagnosis not present

## 2020-02-11 DIAGNOSIS — G249 Dystonia, unspecified: Secondary | ICD-10-CM | POA: Diagnosis not present

## 2020-02-11 DIAGNOSIS — F0280 Dementia in other diseases classified elsewhere without behavioral disturbance: Secondary | ICD-10-CM | POA: Diagnosis not present

## 2020-02-11 DIAGNOSIS — M80011D Age-related osteoporosis with current pathological fracture, right shoulder, subsequent encounter for fracture with routine healing: Secondary | ICD-10-CM | POA: Diagnosis not present

## 2020-02-15 DIAGNOSIS — M80011D Age-related osteoporosis with current pathological fracture, right shoulder, subsequent encounter for fracture with routine healing: Secondary | ICD-10-CM | POA: Diagnosis not present

## 2020-02-15 DIAGNOSIS — G249 Dystonia, unspecified: Secondary | ICD-10-CM | POA: Diagnosis not present

## 2020-02-15 DIAGNOSIS — R296 Repeated falls: Secondary | ICD-10-CM | POA: Diagnosis not present

## 2020-02-15 DIAGNOSIS — G309 Alzheimer's disease, unspecified: Secondary | ICD-10-CM | POA: Diagnosis not present

## 2020-02-15 DIAGNOSIS — F0280 Dementia in other diseases classified elsewhere without behavioral disturbance: Secondary | ICD-10-CM | POA: Diagnosis not present

## 2020-02-15 DIAGNOSIS — R634 Abnormal weight loss: Secondary | ICD-10-CM | POA: Diagnosis not present

## 2020-02-20 ENCOUNTER — Telehealth: Payer: Self-pay | Admitting: Internal Medicine

## 2020-02-20 NOTE — Telephone Encounter (Signed)
I called to confirm patient's appointment for tomorrow for her 3 month follow up.  I spoke to patient's husband and he said patient's at home, but with Hospice. He said it would be very hard to bring her in to the office.  I cancelled patient's appointment for tomorrow.

## 2020-02-21 ENCOUNTER — Ambulatory Visit: Payer: Medicare Other | Admitting: Internal Medicine

## 2020-02-21 NOTE — Telephone Encounter (Signed)
I left a detailed message on patient's voice mail. °

## 2020-02-21 NOTE — Telephone Encounter (Signed)
Let him know that I will put her on my home visit list and try to make a visit in the next few weeks when I am at [facility]

## 2020-02-22 DIAGNOSIS — M80011D Age-related osteoporosis with current pathological fracture, right shoulder, subsequent encounter for fracture with routine healing: Secondary | ICD-10-CM | POA: Diagnosis not present

## 2020-02-22 DIAGNOSIS — R296 Repeated falls: Secondary | ICD-10-CM | POA: Diagnosis not present

## 2020-02-22 DIAGNOSIS — G249 Dystonia, unspecified: Secondary | ICD-10-CM | POA: Diagnosis not present

## 2020-02-22 DIAGNOSIS — F0280 Dementia in other diseases classified elsewhere without behavioral disturbance: Secondary | ICD-10-CM | POA: Diagnosis not present

## 2020-02-22 DIAGNOSIS — R634 Abnormal weight loss: Secondary | ICD-10-CM | POA: Diagnosis not present

## 2020-02-22 DIAGNOSIS — G309 Alzheimer's disease, unspecified: Secondary | ICD-10-CM | POA: Diagnosis not present

## 2020-02-28 DIAGNOSIS — G249 Dystonia, unspecified: Secondary | ICD-10-CM | POA: Diagnosis not present

## 2020-02-28 DIAGNOSIS — R627 Adult failure to thrive: Secondary | ICD-10-CM | POA: Diagnosis not present

## 2020-02-28 DIAGNOSIS — E559 Vitamin D deficiency, unspecified: Secondary | ICD-10-CM | POA: Diagnosis not present

## 2020-02-28 DIAGNOSIS — R296 Repeated falls: Secondary | ICD-10-CM | POA: Diagnosis not present

## 2020-02-28 DIAGNOSIS — G309 Alzheimer's disease, unspecified: Secondary | ICD-10-CM | POA: Diagnosis not present

## 2020-02-28 DIAGNOSIS — F0280 Dementia in other diseases classified elsewhere without behavioral disturbance: Secondary | ICD-10-CM | POA: Diagnosis not present

## 2020-02-28 DIAGNOSIS — R634 Abnormal weight loss: Secondary | ICD-10-CM | POA: Diagnosis not present

## 2020-02-28 DIAGNOSIS — M80011D Age-related osteoporosis with current pathological fracture, right shoulder, subsequent encounter for fracture with routine healing: Secondary | ICD-10-CM | POA: Diagnosis not present

## 2020-02-28 DIAGNOSIS — Z87448 Personal history of other diseases of urinary system: Secondary | ICD-10-CM | POA: Diagnosis not present

## 2020-02-28 DIAGNOSIS — Z87311 Personal history of (healed) other pathological fracture: Secondary | ICD-10-CM | POA: Diagnosis not present

## 2020-02-29 DIAGNOSIS — R634 Abnormal weight loss: Secondary | ICD-10-CM | POA: Diagnosis not present

## 2020-02-29 DIAGNOSIS — G249 Dystonia, unspecified: Secondary | ICD-10-CM | POA: Diagnosis not present

## 2020-02-29 DIAGNOSIS — G309 Alzheimer's disease, unspecified: Secondary | ICD-10-CM | POA: Diagnosis not present

## 2020-02-29 DIAGNOSIS — R296 Repeated falls: Secondary | ICD-10-CM | POA: Diagnosis not present

## 2020-02-29 DIAGNOSIS — F0280 Dementia in other diseases classified elsewhere without behavioral disturbance: Secondary | ICD-10-CM | POA: Diagnosis not present

## 2020-02-29 DIAGNOSIS — M80011D Age-related osteoporosis with current pathological fracture, right shoulder, subsequent encounter for fracture with routine healing: Secondary | ICD-10-CM | POA: Diagnosis not present

## 2020-03-04 DIAGNOSIS — M80011D Age-related osteoporosis with current pathological fracture, right shoulder, subsequent encounter for fracture with routine healing: Secondary | ICD-10-CM | POA: Diagnosis not present

## 2020-03-04 DIAGNOSIS — R634 Abnormal weight loss: Secondary | ICD-10-CM | POA: Diagnosis not present

## 2020-03-04 DIAGNOSIS — G249 Dystonia, unspecified: Secondary | ICD-10-CM | POA: Diagnosis not present

## 2020-03-04 DIAGNOSIS — R296 Repeated falls: Secondary | ICD-10-CM | POA: Diagnosis not present

## 2020-03-04 DIAGNOSIS — F0280 Dementia in other diseases classified elsewhere without behavioral disturbance: Secondary | ICD-10-CM | POA: Diagnosis not present

## 2020-03-04 DIAGNOSIS — G309 Alzheimer's disease, unspecified: Secondary | ICD-10-CM | POA: Diagnosis not present

## 2020-03-07 DIAGNOSIS — M80011D Age-related osteoporosis with current pathological fracture, right shoulder, subsequent encounter for fracture with routine healing: Secondary | ICD-10-CM | POA: Diagnosis not present

## 2020-03-07 DIAGNOSIS — R296 Repeated falls: Secondary | ICD-10-CM | POA: Diagnosis not present

## 2020-03-07 DIAGNOSIS — F0280 Dementia in other diseases classified elsewhere without behavioral disturbance: Secondary | ICD-10-CM | POA: Diagnosis not present

## 2020-03-07 DIAGNOSIS — G249 Dystonia, unspecified: Secondary | ICD-10-CM | POA: Diagnosis not present

## 2020-03-07 DIAGNOSIS — G309 Alzheimer's disease, unspecified: Secondary | ICD-10-CM | POA: Diagnosis not present

## 2020-03-07 DIAGNOSIS — R634 Abnormal weight loss: Secondary | ICD-10-CM | POA: Diagnosis not present

## 2020-03-12 ENCOUNTER — Encounter: Payer: Self-pay | Admitting: Internal Medicine

## 2020-03-12 ENCOUNTER — Other Ambulatory Visit: Payer: Self-pay

## 2020-03-12 ENCOUNTER — Ambulatory Visit: Payer: Medicare Other | Admitting: Internal Medicine

## 2020-03-12 DIAGNOSIS — M810 Age-related osteoporosis without current pathological fracture: Secondary | ICD-10-CM

## 2020-03-12 DIAGNOSIS — E440 Moderate protein-calorie malnutrition: Secondary | ICD-10-CM

## 2020-03-12 DIAGNOSIS — F0280 Dementia in other diseases classified elsewhere without behavioral disturbance: Secondary | ICD-10-CM | POA: Diagnosis not present

## 2020-03-12 DIAGNOSIS — F39 Unspecified mood [affective] disorder: Secondary | ICD-10-CM

## 2020-03-12 DIAGNOSIS — G301 Alzheimer's disease with late onset: Secondary | ICD-10-CM

## 2020-03-12 NOTE — Assessment & Plan Note (Signed)
Moderate She is needing more help and husband is upset that [facility] wasn't able to provide the resources for her (and wasn't allowed to go into Memory Care). Discussed administrative changes, etc He can increase home care services May want to consider the new health care when it opens

## 2020-03-12 NOTE — Progress Notes (Signed)
Subjective:    Patient ID: Kathryn Hodges, female    DOB: [birth year], 86 y.o.   MRN: [id number]  HPI Home visit for this home bound patient with severe dementia Now on hospice care Husband is here  He has hired considerable help Has [facility] for 4 hours  per week and someone overnight 2 nights per week Hospice aide for bathing/washing hair (won't allow shower)--weekly RN in at least weekly Daughters have come some weekends to help  [facility] with walker and hands on assist Will occasionally get up on her own---and is not stable (no recent falls fortunately) They bring her to the bathroom--- generally continent  Some constipation--has been a week since the last Senna-s (3 days a week) and miralax in prune juice  Still gets anxious/irritable---"she has a mind of her own" Cries a lot--?deressed. No real interests or activities she likes  Current Outpatient Medications on File Prior to Visit  Medication Sig Dispense Refill  . acetaminophen (TYLENOL) 650 MG CR tablet Take 650 mg by mouth in the morning and at bedtime.    . melatonin 3 MG TABS tablet Take 3 mg by mouth at bedtime.    . senna (SENOKOT) 8.6 MG TABS tablet Take 1 tablet by mouth daily.    Marland Kitchen ibandronate (BONIVA) 150 MG tablet TAKE 1 TABLET BY MOUTH EVERY 30 DAYS. TAKE IN THE MORNING WITH A FULLGLASS OFWATER & ON AN EMPTY STOMACH 1 tablet 11  . Vitamin D, Ergocalciferol, (DRISDOL) 1.25 MG (50000 UT) CAPS capsule TAKE 1 CAPSULE BY MOUTH EVERY 30 DAYS 3 capsule 3   No current facility-administered medications on file prior to visit.    Allergies  Allergen Reactions  . Sulfa Antibiotics Hives, Itching, Rash and Other (See Comments)    Other Reaction: Other reaction Other Reaction: Other reaction Other reaction(s): Other (See Comments) Other Reaction: Other reaction    Past Medical History:  Diagnosis Date  . Cancer ([place])    squamous cell skin ca  . Dementia ([place])   . Osteoporosis, senile   . Patella fracture     Left 2014, bilateral 9/15  . Spastic dysphonia     Past Surgical History:  Procedure Laterality Date  . ROTATOR CUFF REPAIR Left   . SKIN CANCER EXCISION     SCC on scalp  . VAGINAL HYSTERECTOMY      Family History  Problem Relation Age of Onset  . Dementia Brother     Social History   Socioeconomic History  . Marital status: Married    Spouse name: Not on file  . Number of children: 4  . Years of education: Not on file  . Highest education level: Not on file  Occupational History  . Occupation: Electronics engineer    Comment: Retired  Tobacco Use  . Smoking status: Never Smoker  . Smokeless tobacco: Never Used  Substance and Sexual Activity  . Alcohol use: No    Alcohol/week: 0.0 standard drinks  . Drug use: No  . Sexual activity: Never  Other Topics Concern  . Not on file  Social History Narrative   No living will   Requests husband as health care POA--alternate is SIL Tempie Hoist)   Would accept resuscitation attempts   Not sure about tube feeds   Social Determinants of Health   Financial Resource Strain:   . Difficulty of Paying Living Expenses:   Food Insecurity:   . Worried About Charity fundraiser in the Last Year:   .  Ran Out of Food in the Last Year:   Transportation Needs:   . Film/video editor (Medical):   Marland Kitchen Lack of Transportation (Non-Medical):   Physical Activity:   . Days of Exercise per Week:   . Minutes of Exercise per Session:   Stress:   . Feeling of Stress :   Social Connections:   . Frequency of Communication with Friends and Family:   . Frequency of Social Gatherings with Friends and Family:   . Attends Religious Services:   . Active Member of Clubs or Organizations:   . Attends Archivist Meetings:   Marland Kitchen Marital Status:   Intimate Partner Violence:   . Fear of Current or Ex-Partner:   . Emotionally Abused:   Marland Kitchen Physically Abused:   . Sexually Abused:    Review of Systems Fair appetite but  still losing weight Sleeps okay--but nocturia and needs help. Will be restless and husband has to get up with her Took the first [place] vaccine--did okay. He decided to hold off in fear of more serious reaction with the second one90    Objective:   Physical Exam  Constitutional:  Clear wasting but no distress  Neck: No thyromegaly present.  Cardiovascular: Normal rate, regular rhythm and normal heart sounds. Exam reveals no gallop.  No murmur heard. Respiratory: Effort normal and breath sounds normal. No respiratory distress. She has no wheezes. She has no rales.  GI: Soft. There is no abdominal tenderness.  Musculoskeletal:        General: No edema.  Lymphadenopathy:    She has no cervical adenopathy.  Neurological:  No focal weakness Passive but cooperative with exam, etc  Psychiatric: She has a normal mood and affect. Her behavior is normal.           Assessment & Plan:

## 2020-03-12 NOTE — Assessment & Plan Note (Signed)
Has lost more weight despite the fair appetite Continues on hospice

## 2020-03-12 NOTE — Assessment & Plan Note (Signed)
Cries a fair bit Clearly anhedonic--but it seems to be related to the dementia Doubt medication would help and clear risk for side effects---especially falls

## 2020-03-12 NOTE — Assessment & Plan Note (Signed)
Continues on bisphosphonate and vitamin D

## 2020-03-14 DIAGNOSIS — R296 Repeated falls: Secondary | ICD-10-CM | POA: Diagnosis not present

## 2020-03-14 DIAGNOSIS — F0280 Dementia in other diseases classified elsewhere without behavioral disturbance: Secondary | ICD-10-CM | POA: Diagnosis not present

## 2020-03-14 DIAGNOSIS — G249 Dystonia, unspecified: Secondary | ICD-10-CM | POA: Diagnosis not present

## 2020-03-14 DIAGNOSIS — G309 Alzheimer's disease, unspecified: Secondary | ICD-10-CM | POA: Diagnosis not present

## 2020-03-14 DIAGNOSIS — M80011D Age-related osteoporosis with current pathological fracture, right shoulder, subsequent encounter for fracture with routine healing: Secondary | ICD-10-CM | POA: Diagnosis not present

## 2020-03-14 DIAGNOSIS — R634 Abnormal weight loss: Secondary | ICD-10-CM | POA: Diagnosis not present

## 2020-03-15 DIAGNOSIS — R296 Repeated falls: Secondary | ICD-10-CM | POA: Diagnosis not present

## 2020-03-15 DIAGNOSIS — G309 Alzheimer's disease, unspecified: Secondary | ICD-10-CM | POA: Diagnosis not present

## 2020-03-15 DIAGNOSIS — R634 Abnormal weight loss: Secondary | ICD-10-CM | POA: Diagnosis not present

## 2020-03-15 DIAGNOSIS — M80011D Age-related osteoporosis with current pathological fracture, right shoulder, subsequent encounter for fracture with routine healing: Secondary | ICD-10-CM | POA: Diagnosis not present

## 2020-03-15 DIAGNOSIS — F0280 Dementia in other diseases classified elsewhere without behavioral disturbance: Secondary | ICD-10-CM | POA: Diagnosis not present

## 2020-03-15 DIAGNOSIS — G249 Dystonia, unspecified: Secondary | ICD-10-CM | POA: Diagnosis not present

## 2020-03-18 DIAGNOSIS — R634 Abnormal weight loss: Secondary | ICD-10-CM | POA: Diagnosis not present

## 2020-03-18 DIAGNOSIS — G249 Dystonia, unspecified: Secondary | ICD-10-CM | POA: Diagnosis not present

## 2020-03-18 DIAGNOSIS — M80011D Age-related osteoporosis with current pathological fracture, right shoulder, subsequent encounter for fracture with routine healing: Secondary | ICD-10-CM | POA: Diagnosis not present

## 2020-03-18 DIAGNOSIS — R296 Repeated falls: Secondary | ICD-10-CM | POA: Diagnosis not present

## 2020-03-18 DIAGNOSIS — G309 Alzheimer's disease, unspecified: Secondary | ICD-10-CM | POA: Diagnosis not present

## 2020-03-18 DIAGNOSIS — F0280 Dementia in other diseases classified elsewhere without behavioral disturbance: Secondary | ICD-10-CM | POA: Diagnosis not present

## 2020-03-19 ENCOUNTER — Telehealth: Payer: Self-pay | Admitting: Internal Medicine

## 2020-03-19 DIAGNOSIS — R296 Repeated falls: Secondary | ICD-10-CM | POA: Diagnosis not present

## 2020-03-19 DIAGNOSIS — G249 Dystonia, unspecified: Secondary | ICD-10-CM | POA: Diagnosis not present

## 2020-03-19 DIAGNOSIS — F0280 Dementia in other diseases classified elsewhere without behavioral disturbance: Secondary | ICD-10-CM | POA: Diagnosis not present

## 2020-03-19 DIAGNOSIS — G309 Alzheimer's disease, unspecified: Secondary | ICD-10-CM | POA: Diagnosis not present

## 2020-03-19 DIAGNOSIS — R634 Abnormal weight loss: Secondary | ICD-10-CM | POA: Diagnosis not present

## 2020-03-19 DIAGNOSIS — M80011D Age-related osteoporosis with current pathological fracture, right shoulder, subsequent encounter for fracture with routine healing: Secondary | ICD-10-CM | POA: Diagnosis not present

## 2020-03-19 MED ORDER — LORAZEPAM 0.5 MG PO TABS: 0.2500 mg | ORAL_TABLET | ORAL | 0 refills | Status: AC | PRN

## 2020-03-19 NOTE — Telephone Encounter (Signed)
Jan with Hospice at [facility] calling :  Pt having increased agitation  Requesting Rx for Lorazepam, PRN use  Pharmacy : [facility]

## 2020-03-19 NOTE — Telephone Encounter (Signed)
I let Kathryn Hodges know that the Rx was sent

## 2020-03-21 DIAGNOSIS — R634 Abnormal weight loss: Secondary | ICD-10-CM | POA: Diagnosis not present

## 2020-03-21 DIAGNOSIS — G249 Dystonia, unspecified: Secondary | ICD-10-CM | POA: Diagnosis not present

## 2020-03-21 DIAGNOSIS — R296 Repeated falls: Secondary | ICD-10-CM | POA: Diagnosis not present

## 2020-03-21 DIAGNOSIS — G309 Alzheimer's disease, unspecified: Secondary | ICD-10-CM | POA: Diagnosis not present

## 2020-03-21 DIAGNOSIS — F0280 Dementia in other diseases classified elsewhere without behavioral disturbance: Secondary | ICD-10-CM | POA: Diagnosis not present

## 2020-03-21 DIAGNOSIS — M80011D Age-related osteoporosis with current pathological fracture, right shoulder, subsequent encounter for fracture with routine healing: Secondary | ICD-10-CM | POA: Diagnosis not present

## 2020-03-27 DIAGNOSIS — M80011D Age-related osteoporosis with current pathological fracture, right shoulder, subsequent encounter for fracture with routine healing: Secondary | ICD-10-CM | POA: Diagnosis not present

## 2020-03-27 DIAGNOSIS — R296 Repeated falls: Secondary | ICD-10-CM | POA: Diagnosis not present

## 2020-03-27 DIAGNOSIS — G249 Dystonia, unspecified: Secondary | ICD-10-CM | POA: Diagnosis not present

## 2020-03-27 DIAGNOSIS — R634 Abnormal weight loss: Secondary | ICD-10-CM | POA: Diagnosis not present

## 2020-03-27 DIAGNOSIS — F0280 Dementia in other diseases classified elsewhere without behavioral disturbance: Secondary | ICD-10-CM | POA: Diagnosis not present

## 2020-03-27 DIAGNOSIS — G309 Alzheimer's disease, unspecified: Secondary | ICD-10-CM | POA: Diagnosis not present

## 2020-03-28 DIAGNOSIS — F0280 Dementia in other diseases classified elsewhere without behavioral disturbance: Secondary | ICD-10-CM | POA: Diagnosis not present

## 2020-03-28 DIAGNOSIS — R296 Repeated falls: Secondary | ICD-10-CM | POA: Diagnosis not present

## 2020-03-28 DIAGNOSIS — G309 Alzheimer's disease, unspecified: Secondary | ICD-10-CM | POA: Diagnosis not present

## 2020-03-28 DIAGNOSIS — R634 Abnormal weight loss: Secondary | ICD-10-CM | POA: Diagnosis not present

## 2020-03-28 DIAGNOSIS — M80011D Age-related osteoporosis with current pathological fracture, right shoulder, subsequent encounter for fracture with routine healing: Secondary | ICD-10-CM | POA: Diagnosis not present

## 2020-03-28 DIAGNOSIS — G249 Dystonia, unspecified: Secondary | ICD-10-CM | POA: Diagnosis not present

## 2020-03-29 DIAGNOSIS — G309 Alzheimer's disease, unspecified: Secondary | ICD-10-CM | POA: Diagnosis not present

## 2020-03-29 DIAGNOSIS — Z87311 Personal history of (healed) other pathological fracture: Secondary | ICD-10-CM | POA: Diagnosis not present

## 2020-03-29 DIAGNOSIS — F0280 Dementia in other diseases classified elsewhere without behavioral disturbance: Secondary | ICD-10-CM | POA: Diagnosis not present

## 2020-03-29 DIAGNOSIS — M80011D Age-related osteoporosis with current pathological fracture, right shoulder, subsequent encounter for fracture with routine healing: Secondary | ICD-10-CM | POA: Diagnosis not present

## 2020-03-29 DIAGNOSIS — R296 Repeated falls: Secondary | ICD-10-CM | POA: Diagnosis not present

## 2020-03-29 DIAGNOSIS — Z87448 Personal history of other diseases of urinary system: Secondary | ICD-10-CM | POA: Diagnosis not present

## 2020-03-29 DIAGNOSIS — G249 Dystonia, unspecified: Secondary | ICD-10-CM | POA: Diagnosis not present

## 2020-03-29 DIAGNOSIS — Z681 Body mass index (BMI) 19 or less, adult: Secondary | ICD-10-CM | POA: Diagnosis not present

## 2020-03-29 DIAGNOSIS — R627 Adult failure to thrive: Secondary | ICD-10-CM | POA: Diagnosis not present

## 2020-03-29 DIAGNOSIS — R634 Abnormal weight loss: Secondary | ICD-10-CM | POA: Diagnosis not present

## 2020-03-29 DIAGNOSIS — E559 Vitamin D deficiency, unspecified: Secondary | ICD-10-CM | POA: Diagnosis not present

## 2020-04-01 ENCOUNTER — Telehealth: Payer: Self-pay | Admitting: *Deleted

## 2020-04-01 NOTE — Telephone Encounter (Signed)
Jan clinical nurse with [facility] left a voicemail stating that patient was prescribed Lorazepam prn. Jan stating that they are having to give the medication once to twice a day. Jan is wanting to know if they can change this to scheduled dosing? Jan requested a call back regarding the change.

## 2020-04-01 NOTE — Telephone Encounter (Signed)
Left orders for lorazepam change. Corrected med list.

## 2020-04-01 NOTE — Telephone Encounter (Signed)
Okay to change to bid PLUS bid prn Please adjust the med list to this Find out if she needs a refill

## 2020-04-02 DIAGNOSIS — G249 Dystonia, unspecified: Secondary | ICD-10-CM | POA: Diagnosis not present

## 2020-04-02 DIAGNOSIS — R296 Repeated falls: Secondary | ICD-10-CM | POA: Diagnosis not present

## 2020-04-02 DIAGNOSIS — M80011D Age-related osteoporosis with current pathological fracture, right shoulder, subsequent encounter for fracture with routine healing: Secondary | ICD-10-CM | POA: Diagnosis not present

## 2020-04-02 DIAGNOSIS — F0280 Dementia in other diseases classified elsewhere without behavioral disturbance: Secondary | ICD-10-CM | POA: Diagnosis not present

## 2020-04-02 DIAGNOSIS — G309 Alzheimer's disease, unspecified: Secondary | ICD-10-CM | POA: Diagnosis not present

## 2020-04-02 DIAGNOSIS — R634 Abnormal weight loss: Secondary | ICD-10-CM | POA: Diagnosis not present

## 2020-04-03 DIAGNOSIS — R634 Abnormal weight loss: Secondary | ICD-10-CM | POA: Diagnosis not present

## 2020-04-03 DIAGNOSIS — G309 Alzheimer's disease, unspecified: Secondary | ICD-10-CM | POA: Diagnosis not present

## 2020-04-03 DIAGNOSIS — M80011D Age-related osteoporosis with current pathological fracture, right shoulder, subsequent encounter for fracture with routine healing: Secondary | ICD-10-CM | POA: Diagnosis not present

## 2020-04-03 DIAGNOSIS — F0280 Dementia in other diseases classified elsewhere without behavioral disturbance: Secondary | ICD-10-CM | POA: Diagnosis not present

## 2020-04-03 DIAGNOSIS — R296 Repeated falls: Secondary | ICD-10-CM | POA: Diagnosis not present

## 2020-04-03 DIAGNOSIS — G249 Dystonia, unspecified: Secondary | ICD-10-CM | POA: Diagnosis not present

## 2020-04-04 DIAGNOSIS — R296 Repeated falls: Secondary | ICD-10-CM | POA: Diagnosis not present

## 2020-04-04 DIAGNOSIS — G249 Dystonia, unspecified: Secondary | ICD-10-CM | POA: Diagnosis not present

## 2020-04-04 DIAGNOSIS — G309 Alzheimer's disease, unspecified: Secondary | ICD-10-CM | POA: Diagnosis not present

## 2020-04-04 DIAGNOSIS — R634 Abnormal weight loss: Secondary | ICD-10-CM | POA: Diagnosis not present

## 2020-04-04 DIAGNOSIS — F0280 Dementia in other diseases classified elsewhere without behavioral disturbance: Secondary | ICD-10-CM | POA: Diagnosis not present

## 2020-04-04 DIAGNOSIS — M80011D Age-related osteoporosis with current pathological fracture, right shoulder, subsequent encounter for fracture with routine healing: Secondary | ICD-10-CM | POA: Diagnosis not present

## 2020-04-06 DIAGNOSIS — G309 Alzheimer's disease, unspecified: Secondary | ICD-10-CM | POA: Diagnosis not present

## 2020-04-06 DIAGNOSIS — G249 Dystonia, unspecified: Secondary | ICD-10-CM | POA: Diagnosis not present

## 2020-04-06 DIAGNOSIS — R296 Repeated falls: Secondary | ICD-10-CM | POA: Diagnosis not present

## 2020-04-06 DIAGNOSIS — R634 Abnormal weight loss: Secondary | ICD-10-CM | POA: Diagnosis not present

## 2020-04-06 DIAGNOSIS — M80011D Age-related osteoporosis with current pathological fracture, right shoulder, subsequent encounter for fracture with routine healing: Secondary | ICD-10-CM | POA: Diagnosis not present

## 2020-04-06 DIAGNOSIS — F0280 Dementia in other diseases classified elsewhere without behavioral disturbance: Secondary | ICD-10-CM | POA: Diagnosis not present

## 2020-04-07 DIAGNOSIS — G249 Dystonia, unspecified: Secondary | ICD-10-CM | POA: Diagnosis not present

## 2020-04-07 DIAGNOSIS — M80011D Age-related osteoporosis with current pathological fracture, right shoulder, subsequent encounter for fracture with routine healing: Secondary | ICD-10-CM | POA: Diagnosis not present

## 2020-04-07 DIAGNOSIS — F0280 Dementia in other diseases classified elsewhere without behavioral disturbance: Secondary | ICD-10-CM | POA: Diagnosis not present

## 2020-04-07 DIAGNOSIS — G309 Alzheimer's disease, unspecified: Secondary | ICD-10-CM | POA: Diagnosis not present

## 2020-04-07 DIAGNOSIS — R296 Repeated falls: Secondary | ICD-10-CM | POA: Diagnosis not present

## 2020-04-07 DIAGNOSIS — R634 Abnormal weight loss: Secondary | ICD-10-CM | POA: Diagnosis not present

## 2020-04-08 DIAGNOSIS — G249 Dystonia, unspecified: Secondary | ICD-10-CM | POA: Diagnosis not present

## 2020-04-08 DIAGNOSIS — R634 Abnormal weight loss: Secondary | ICD-10-CM | POA: Diagnosis not present

## 2020-04-08 DIAGNOSIS — F0280 Dementia in other diseases classified elsewhere without behavioral disturbance: Secondary | ICD-10-CM | POA: Diagnosis not present

## 2020-04-08 DIAGNOSIS — R296 Repeated falls: Secondary | ICD-10-CM | POA: Diagnosis not present

## 2020-04-08 DIAGNOSIS — G309 Alzheimer's disease, unspecified: Secondary | ICD-10-CM | POA: Diagnosis not present

## 2020-04-08 DIAGNOSIS — M80011D Age-related osteoporosis with current pathological fracture, right shoulder, subsequent encounter for fracture with routine healing: Secondary | ICD-10-CM | POA: Diagnosis not present

## 2020-04-09 DIAGNOSIS — R296 Repeated falls: Secondary | ICD-10-CM | POA: Diagnosis not present

## 2020-04-09 DIAGNOSIS — G309 Alzheimer's disease, unspecified: Secondary | ICD-10-CM | POA: Diagnosis not present

## 2020-04-09 DIAGNOSIS — F0280 Dementia in other diseases classified elsewhere without behavioral disturbance: Secondary | ICD-10-CM | POA: Diagnosis not present

## 2020-04-09 DIAGNOSIS — G249 Dystonia, unspecified: Secondary | ICD-10-CM | POA: Diagnosis not present

## 2020-04-09 DIAGNOSIS — R634 Abnormal weight loss: Secondary | ICD-10-CM | POA: Diagnosis not present

## 2020-04-09 DIAGNOSIS — M80011D Age-related osteoporosis with current pathological fracture, right shoulder, subsequent encounter for fracture with routine healing: Secondary | ICD-10-CM | POA: Diagnosis not present

## 2020-04-10 DIAGNOSIS — G309 Alzheimer's disease, unspecified: Secondary | ICD-10-CM | POA: Diagnosis not present

## 2020-04-10 DIAGNOSIS — R634 Abnormal weight loss: Secondary | ICD-10-CM | POA: Diagnosis not present

## 2020-04-10 DIAGNOSIS — M80011D Age-related osteoporosis with current pathological fracture, right shoulder, subsequent encounter for fracture with routine healing: Secondary | ICD-10-CM | POA: Diagnosis not present

## 2020-04-10 DIAGNOSIS — G249 Dystonia, unspecified: Secondary | ICD-10-CM | POA: Diagnosis not present

## 2020-04-10 DIAGNOSIS — F0280 Dementia in other diseases classified elsewhere without behavioral disturbance: Secondary | ICD-10-CM | POA: Diagnosis not present

## 2020-04-10 DIAGNOSIS — R296 Repeated falls: Secondary | ICD-10-CM | POA: Diagnosis not present

## 2020-04-11 DIAGNOSIS — R634 Abnormal weight loss: Secondary | ICD-10-CM | POA: Diagnosis not present

## 2020-04-11 DIAGNOSIS — M80011D Age-related osteoporosis with current pathological fracture, right shoulder, subsequent encounter for fracture with routine healing: Secondary | ICD-10-CM | POA: Diagnosis not present

## 2020-04-11 DIAGNOSIS — F0280 Dementia in other diseases classified elsewhere without behavioral disturbance: Secondary | ICD-10-CM | POA: Diagnosis not present

## 2020-04-11 DIAGNOSIS — G309 Alzheimer's disease, unspecified: Secondary | ICD-10-CM | POA: Diagnosis not present

## 2020-04-11 DIAGNOSIS — R296 Repeated falls: Secondary | ICD-10-CM | POA: Diagnosis not present

## 2020-04-11 DIAGNOSIS — G249 Dystonia, unspecified: Secondary | ICD-10-CM | POA: Diagnosis not present

## 2020-04-12 DIAGNOSIS — G309 Alzheimer's disease, unspecified: Secondary | ICD-10-CM | POA: Diagnosis not present

## 2020-04-12 DIAGNOSIS — R296 Repeated falls: Secondary | ICD-10-CM | POA: Diagnosis not present

## 2020-04-12 DIAGNOSIS — G249 Dystonia, unspecified: Secondary | ICD-10-CM | POA: Diagnosis not present

## 2020-04-12 DIAGNOSIS — R634 Abnormal weight loss: Secondary | ICD-10-CM | POA: Diagnosis not present

## 2020-04-12 DIAGNOSIS — M80011D Age-related osteoporosis with current pathological fracture, right shoulder, subsequent encounter for fracture with routine healing: Secondary | ICD-10-CM | POA: Diagnosis not present

## 2020-04-12 DIAGNOSIS — F0280 Dementia in other diseases classified elsewhere without behavioral disturbance: Secondary | ICD-10-CM | POA: Diagnosis not present

## 2020-04-13 DIAGNOSIS — F0280 Dementia in other diseases classified elsewhere without behavioral disturbance: Secondary | ICD-10-CM | POA: Diagnosis not present

## 2020-04-13 DIAGNOSIS — R296 Repeated falls: Secondary | ICD-10-CM | POA: Diagnosis not present

## 2020-04-13 DIAGNOSIS — G309 Alzheimer's disease, unspecified: Secondary | ICD-10-CM | POA: Diagnosis not present

## 2020-04-13 DIAGNOSIS — M80011D Age-related osteoporosis with current pathological fracture, right shoulder, subsequent encounter for fracture with routine healing: Secondary | ICD-10-CM | POA: Diagnosis not present

## 2020-04-13 DIAGNOSIS — R634 Abnormal weight loss: Secondary | ICD-10-CM | POA: Diagnosis not present

## 2020-04-13 DIAGNOSIS — G249 Dystonia, unspecified: Secondary | ICD-10-CM | POA: Diagnosis not present

## 2020-04-29 DEATH — deceased

## 2020-05-05 ENCOUNTER — Telehealth: Payer: Self-pay | Admitting: Internal Medicine

## 2020-05-05 NOTE — Telephone Encounter (Signed)
I don't have a copy of the death certificate.  Dr.Letvak, Did you receive the death certificate?

## 2020-05-05 NOTE — Telephone Encounter (Signed)
Patient's husband called in regards to the patient's death certificate  Patient stated it has been 3 weeks and he would like to know the status. Patient wanted to know if this has been sent to the register of deeds. Patient would like a call back

## 2020-05-05 NOTE — Telephone Encounter (Signed)
I left a detailed message on Mr. Kathryn Hodges's voice mail with Dr. Alla German comments.

## 2020-05-05 NOTE — Telephone Encounter (Signed)
She died at the hospice home so their physician would have filled out the death certificate. The undertaker should have copy (and be able to help him get the certified copies he will need)

## 2020-05-20 ENCOUNTER — Ambulatory Visit: Payer: Medicare Other | Admitting: Podiatry
